# Patient Record
Sex: Female | Born: 1980
Health system: Southern US, Community
[De-identification: ages and names within clinical notes are randomized; demographics above are authoritative.]

## PROBLEM LIST (undated history)

## (undated) DIAGNOSIS — R7303 Prediabetes: Secondary | ICD-10-CM

## (undated) DIAGNOSIS — E538 Deficiency of other specified B group vitamins: Secondary | ICD-10-CM

## (undated) DIAGNOSIS — E782 Mixed hyperlipidemia: Secondary | ICD-10-CM

## (undated) DIAGNOSIS — D509 Iron deficiency anemia, unspecified: Secondary | ICD-10-CM

## (undated) DIAGNOSIS — E559 Vitamin D deficiency, unspecified: Secondary | ICD-10-CM

## (undated) DIAGNOSIS — E785 Hyperlipidemia, unspecified: Secondary | ICD-10-CM

## (undated) DIAGNOSIS — R0600 Dyspnea, unspecified: Secondary | ICD-10-CM

## (undated) DIAGNOSIS — N200 Calculus of kidney: Secondary | ICD-10-CM

## (undated) DIAGNOSIS — Z973 Presence of spectacles and contact lenses: Secondary | ICD-10-CM

## (undated) DIAGNOSIS — K760 Fatty (change of) liver, not elsewhere classified: Secondary | ICD-10-CM

## (undated) HISTORY — DX: Calculus of kidney: N20.0

## (undated) HISTORY — DX: Hyperlipidemia, unspecified: E78.5

---

## 2020-08-23 ENCOUNTER — Ambulatory Visit (INDEPENDENT_AMBULATORY_CARE_PROVIDER_SITE_OTHER): Payer: BC Managed Care – PPO | Admitting: Family Medicine

## 2020-08-23 ENCOUNTER — Encounter: Payer: Self-pay | Admitting: Family Medicine

## 2020-08-23 ENCOUNTER — Other Ambulatory Visit: Payer: Self-pay

## 2020-08-23 VITALS — BP 100/69 | HR 70 | Temp 98.2°F | Ht 63.5 in | Wt 206.6 lb

## 2020-08-23 DIAGNOSIS — E782 Mixed hyperlipidemia: Secondary | ICD-10-CM | POA: Diagnosis not present

## 2020-08-23 DIAGNOSIS — E559 Vitamin D deficiency, unspecified: Secondary | ICD-10-CM | POA: Diagnosis not present

## 2020-08-23 DIAGNOSIS — Z114 Encounter for screening for human immunodeficiency virus [HIV]: Secondary | ICD-10-CM

## 2020-08-23 DIAGNOSIS — Z1159 Encounter for screening for other viral diseases: Secondary | ICD-10-CM

## 2020-08-23 DIAGNOSIS — Z0000 Encounter for general adult medical examination without abnormal findings: Secondary | ICD-10-CM

## 2020-08-23 DIAGNOSIS — Z23 Encounter for immunization: Secondary | ICD-10-CM

## 2020-08-23 DIAGNOSIS — E538 Deficiency of other specified B group vitamins: Secondary | ICD-10-CM | POA: Insufficient documentation

## 2020-08-23 DIAGNOSIS — M5432 Sciatica, left side: Secondary | ICD-10-CM

## 2020-08-23 DIAGNOSIS — R7303 Prediabetes: Secondary | ICD-10-CM

## 2020-08-23 NOTE — Patient Instructions (Addendum)
Sciatica Rehab Ask your health care provider which exercises are safe for you. Do exercises exactly as told by your health care provider and adjust them as directed. It is normal to feel mild stretching, pulling, tightness, or discomfort as you do these exercises. Stop right away if you feel sudden pain or your pain gets worse. Do not begin these exercises until told by your health care provider. Stretching and range-of-motion exercises These exercises warm up your muscles and joints and improve the movement and flexibility of your hips and back. These exercises also help to relieve pain, numbness, and tingling. Sciatic nerve glide 1. Sit in a chair with your head facing down toward your chest. Place your hands behind your back. Let your shoulders slump forward. 2. Slowly straighten one of your legs while you tilt your head back as if you are looking toward the ceiling. Only straighten your leg as far as you can without making your symptoms worse. 3. Hold this position for __________ seconds. 4. Slowly return to the starting position. 5. Repeat with your other leg. Repeat __________ times. Complete this exercise __________ times a day. Knee to chest with hip adduction and internal rotation  1. Lie on your back on a firm surface with both legs straight. 2. Bend one of your knees and move it up toward your chest until you feel a gentle stretch in your lower back and buttock. Then, move your knee toward the shoulder that is on the opposite side from your leg. This is hip adduction and internal rotation. ? Hold your leg in this position by holding on to the front of your knee. 3. Hold this position for __________ seconds. 4. Slowly return to the starting position. 5. Repeat with your other leg. Repeat __________ times. Complete this exercise __________ times a day. Prone extension on elbows  1. Lie on your abdomen on a firm surface. A bed may be too soft for this exercise. 2. Prop yourself up on  your elbows. 3. Use your arms to help lift your chest up until you feel a gentle stretch in your abdomen and your lower back. ? This will place some of your body weight on your elbows. If this is uncomfortable, try stacking pillows under your chest. ? Your hips should stay down, against the surface that you are lying on. Keep your hip and back muscles relaxed. 4. Hold this position for __________ seconds. 5. Slowly relax your upper body and return to the starting position. Repeat __________ times. Complete this exercise __________ times a day. Strengthening exercises These exercises build strength and endurance in your back. Endurance is the ability to use your muscles for a long time, even after they get tired. Pelvic tilt This exercise strengthens the muscles that lie deep in the abdomen. 1. Lie on your back on a firm surface. Bend your knees and keep your feet flat on the floor. 2. Tense your abdominal muscles. Tip your pelvis up toward the ceiling and flatten your lower back into the floor. ? To help with this exercise, you may place a small towel under your lower back and try to push your back into the towel. 3. Hold this position for __________ seconds. 4. Let your muscles relax completely before you repeat this exercise. Repeat __________ times. Complete this exercise __________ times a day. Alternating arm and leg raises  1. Get on your hands and knees on a firm surface. If you are on a hard floor, you may want to use  padding, such as an exercise mat, to cushion your knees. 2. Line up your arms and legs. Your hands should be directly below your shoulders, and your knees should be directly below your hips. 3. Lift your left leg behind you. At the same time, raise your right arm and straighten it in front of you. ? Do not lift your leg higher than your hip. ? Do not lift your arm higher than your shoulder. ? Keep your abdominal and back muscles tight. ? Keep your hips facing the  ground. ? Do not arch your back. ? Keep your balance carefully, and do not hold your breath. 4. Hold this position for __________ seconds. 5. Slowly return to the starting position. 6. Repeat with your right leg and your left arm. Repeat __________ times. Complete this exercise __________ times a day. Posture and body mechanics Good posture and healthy body mechanics can help to relieve stress in your body's tissues and joints. Body mechanics refers to the movements and positions of your body while you do your daily activities. Posture is part of body mechanics. Good posture means:  Your spine is in its natural S-curve position (neutral).  Your shoulders are pulled back slightly.  Your head is not tipped forward. Follow these guidelines to improve your posture and body mechanics in your everyday activities. Standing   When standing, keep your spine neutral and your feet about hip width apart. Keep a slight bend in your knees. Your ears, shoulders, and hips should line up.  When you do a task in which you stand in one place for a long time, place one foot up on a stable object that is 2-4 inches (5-10 cm) high, such as a footstool. This helps keep your spine neutral. Sitting   When sitting, keep your spine neutral and keep your feet flat on the floor. Use a footrest, if necessary, and keep your thighs parallel to the floor. Avoid rounding your shoulders, and avoid tilting your head forward.  When working at a desk or a computer, keep your desk at a height where your hands are slightly lower than your elbows. Slide your chair under your desk so you are close enough to maintain good posture.  When working at a computer, place your monitor at a height where you are looking straight ahead and you do not have to tilt your head forward or downward to look at the screen. Resting  When lying down and resting, avoid positions that are most painful for you.  If you have pain with activities  such as sitting, bending, stooping, or squatting, lie in a position in which your body does not bend very much. For example, avoid curling up on your side with your arms and knees near your chest (fetal position).  If you have pain with activities such as standing for a long time or reaching with your arms, lie with your spine in a neutral position and bend your knees slightly. Try the following positions: ? Lying on your side with a pillow between your knees. ? Lying on your back with a pillow under your knees. Lifting   When lifting objects, keep your feet at least shoulder width apart and tighten your abdominal muscles.  Bend your knees and hips and keep your spine neutral. It is important to lift using the strength of your legs, not your back. Do not lock your knees straight out.  Always ask for help to lift heavy or awkward objects. This information is not  intended to replace advice given to you by your health care provider. Make sure you discuss any questions you have with your health care provider. Document Revised: 03/31/2019 Document Reviewed: 12/29/2018 Elsevier Patient Education  2020 [place] lots of labs Want you to come back for your pap smear Recommend flu shot in October Giving you tdap today.   So nice to meet you!  Dr. Rogers Blocker   Preventive Care 38-70 Years Old, Female Preventive care refers to visits with your health care provider and lifestyle choices that can promote health and wellness. This includes:  A yearly physical exam. This may also be called an annual well check.  Regular dental visits and eye exams.  Immunizations.  Screening for certain conditions.  Healthy lifestyle choices, such as eating a healthy diet, getting regular exercise, not using drugs or products that contain nicotine and tobacco, and limiting alcohol use. What can I expect for my preventive care visit? Physical exam Your health care provider will check your:  Height  and weight. This may be used to calculate body mass index (BMI), which tells if you are at a healthy weight.  Heart rate and blood pressure.  Skin for abnormal spots. Counseling Your health care provider may ask you questions about your:  Alcohol, tobacco, and drug use.  Emotional well-being.  Home and relationship well-being.  Sexual activity.  Eating habits.  Work and work Statistician.  Method of birth control.  Menstrual cycle.  Pregnancy history. What immunizations do I need?  Influenza (flu) vaccine  This is recommended every year. Tetanus, diphtheria, and pertussis (Tdap) vaccine  You may need a Td booster every 10 years. Varicella (chickenpox) vaccine  You may need this if you have not been vaccinated. Zoster (shingles) vaccine  You may need this after age 9. Measles, mumps, and rubella (MMR) vaccine  You may need at least one dose of MMR if you were born in 1957 or later. You may also need a second dose. Pneumococcal conjugate (PCV13) vaccine  You may need this if you have certain conditions and were not previously vaccinated. Pneumococcal polysaccharide (PPSV23) vaccine  You may need one or two doses if you smoke cigarettes or if you have certain conditions. Meningococcal conjugate (MenACWY) vaccine  You may need this if you have certain conditions. Hepatitis A vaccine  You may need this if you have certain conditions or if you travel or work in places where you may be exposed to hepatitis A. Hepatitis B vaccine  You may need this if you have certain conditions or if you travel or work in places where you may be exposed to hepatitis B. Haemophilus influenzae type b (Hib) vaccine  You may need this if you have certain conditions. Human papillomavirus (HPV) vaccine  If recommended by your health care provider, you may need three doses over 6 months. You may receive vaccines as individual doses or as more than one vaccine together in one shot  (combination vaccines). Talk with your health care provider about the risks and benefits of combination vaccines. What tests do I need? Blood tests  Lipid and cholesterol levels. These may be checked every 5 years, or more frequently if you are over 7 years old.  Hepatitis C test.  Hepatitis B test. Screening  Lung cancer screening. You may have this screening every year starting at age 30 if you have a 30-pack-year history of smoking and currently smoke or have quit within the past 15 years.  Colorectal  cancer screening. All adults should have this screening starting at age 48 and continuing until age 36. Your health care provider may recommend screening at age 80 if you are at increased risk. You will have tests every 1-10 years, depending on your results and the type of screening test.  Diabetes screening. This is done by checking your blood sugar (glucose) after you have not eaten for a while (fasting). You may have this done every 1-3 years.  Mammogram. This may be done every 1-2 years. Talk with your health care provider about when you should start having regular mammograms. This may depend on whether you have a family history of breast cancer.  BRCA-related cancer screening. This may be done if you have a family history of breast, ovarian, tubal, or peritoneal cancers.  Pelvic exam and Pap test. This may be done every 3 years starting at age 63. Starting at age 52, this may be done every 5 years if you have a Pap test in combination with an HPV test. Other tests  Sexually transmitted disease (STD) testing.  Bone density scan. This is done to screen for osteoporosis. You may have this scan if you are at high risk for osteoporosis. Follow these instructions at home: Eating and drinking  Eat a diet that includes fresh fruits and vegetables, whole grains, lean protein, and low-fat dairy.  Take vitamin and mineral supplements as recommended by your health care provider.  Do not  drink alcohol if: ? Your health care provider tells you not to drink. ? You are pregnant, may be pregnant, or are planning to become pregnant.  If you drink alcohol: ? Limit how much you have to 0-1 drink a day. ? Be aware of how much alcohol is in your drink. In the [place] one drink equals one 12 oz bottle of beer (355 mL), one 5 oz glass of wine (148 mL), or one 1 oz glass of hard liquor (44 mL). Lifestyle  Take daily care of your teeth and gums.  Stay active. Exercise for at least 30 minutes on 5 or more days each week.  Do not use any products that contain nicotine or tobacco, such as cigarettes, e-cigarettes, and chewing tobacco. If you need help quitting, ask your health care provider.  If you are sexually active, practice safe sex. Use a condom or other form of birth control (contraception) in order to prevent pregnancy and STIs (sexually transmitted infections).  If told by your health care provider, take low-dose aspirin daily starting at age 104. What's next?  Visit your health care provider once a year for a well check visit.  Ask your health care provider how often you should have your eyes and teeth checked.  Stay up to date on all vaccines. This information is not intended to replace advice given to you by your health care provider. Make sure you discuss any questions you have with your health care provider. Document Revised: 08/18/2018 Document Reviewed: 08/18/2018 Elsevier Patient Education  2020 [employer].

## 2020-08-23 NOTE — Progress Notes (Signed)
Patient: Breanna Byrd MRN: [id number] DOB: 1981-11-30 PCP: Orma Flaming, MD     Subjective:  Chief Complaint  Patient presents with  . Annual Exam  . b12 deficiency  . vitamin D deficiency  . Hyperlipidemia  . screening breast cancer  . Prediabetes  . Back Pain    HPI: The patient is a 39 y.o. female who presents today for annual exam. She denies any changes to past medical history. There have been no recent hospitalizations. They are following a well balanced diet and exercise plan.She participates in weight training 2-4 times weekly. Weight has been decreasing steadily. She complains of back pain x 15 days. Also brought all of her records from [place].   She has a weight loss trainer as well.   No family history of colon or breast cancer.   She has history of vitamin D deficiency and b12 deficiency  Hyperlipidemia Was on crestor equivelant in the past in [place] and cholesterol was to goal (brought her cholesterol levels from march in [place]). She has been off medication since that time and would like to recheck.   Left sided back pain Has been going on x 15 days starts in her lower left back and travels down her buttocks, back of her leg. Worse with going up hills. Has not taken any medication and states it is slightly better. Has never happened to her before. She no longer has the pain.   Immunization History  Administered Date(s) Administered  . Tdap 08/23/2020    Colonoscopy: routine screening  Mammogram: last one in 2019 in [place]. Due for this  Pap smear: due for this.  Tdap: will give today    Review of Systems  Constitutional: Negative for chills, fatigue and fever.  HENT: Negative for dental problem, ear pain, hearing loss and trouble swallowing.   Eyes: Negative for visual disturbance.  Respiratory: Negative for cough, chest tightness and shortness of breath.   Cardiovascular: Negative for chest pain, palpitations and leg swelling.  Gastrointestinal:  Negative for blood in stool, diarrhea, nausea and vomiting.  Endocrine: Negative for cold intolerance, polydipsia, polyphagia and polyuria.  Genitourinary: Negative for dysuria, frequency, hematuria and urgency.  Musculoskeletal: Negative for arthralgias.  Skin: Negative for rash.  Neurological: Negative for dizziness and headaches.  Psychiatric/Behavioral: Negative for dysphoric mood and sleep disturbance. The patient is not nervous/anxious.     Allergies Patient has No Known Allergies.  Past Medical History Patient  has no past medical history on file.  Surgical History Patient  has no past surgical history on file.  Family History Pateint's family history is not on file.  Social History Patient  reports that she has never smoked. She has never used smokeless tobacco. She reports previous alcohol use. She reports that she does not use drugs.    Objective: Vitals:   08/23/20 0844  BP: 100/69  Pulse: 70  Temp: 98.2 F (36.8 C)  TempSrc: Temporal  SpO2: 100%  Weight: 206 lb 9.6 oz (93.7 kg)  Height: 5' 3.5" (1.613 m)    Body mass index is 36.02 kg/m.  Physical Exam Vitals reviewed.  Constitutional:      Appearance: Normal appearance. She is well-developed. She is obese.  HENT:     Head: Normocephalic and atraumatic.     Right Ear: Tympanic membrane, ear canal and external ear normal.     Left Ear: Tympanic membrane, ear canal and external ear normal.     Nose: Nose normal.     Mouth/Throat:  Mouth: Mucous membranes are moist.  Eyes:     Extraocular Movements: Extraocular movements intact.     Conjunctiva/sclera: Conjunctivae normal.     Pupils: Pupils are equal, round, and reactive to light.  Neck:     Thyroid: No thyromegaly.     Vascular: No carotid bruit.  Cardiovascular:     Rate and Rhythm: Normal rate and regular rhythm.     Heart sounds: Normal heart sounds. No murmur heard.   Pulmonary:     Effort: Pulmonary effort is normal.     Breath  sounds: Normal breath sounds.  Abdominal:     General: Bowel sounds are normal. There is no distension.     Palpations: Abdomen is soft.     Tenderness: There is no abdominal tenderness.  Musculoskeletal:        General: No tenderness.     Cervical back: Normal range of motion and neck supple.     Comments: No back pain today   Lymphadenopathy:     Cervical: No cervical adenopathy.  Skin:    General: Skin is warm and dry.     Capillary Refill: Capillary refill takes less than 2 seconds.     Findings: No rash.  Neurological:     General: No focal deficit present.     Mental Status: She is alert and oriented to person, place, and time.     Cranial Nerves: No cranial nerve deficit.     Motor: No weakness.     Coordination: Coordination normal.     Gait: Gait normal.     Deep Tendon Reflexes: Reflexes normal.  Psychiatric:        Mood and Affect: Mood normal.        Behavior: Behavior normal.          Office Visit from 08/23/2020 in [place]  PHQ-2 Total Score 0      Assessment/plan: 1. Annual physical exam Routine labs will be done today. She had her records from [place] and I reviewed these as well. HM addressed today. Will need to come back for pap smear. She is working with weight loss trainer and encouraged her to continue this. F/u in 3 months for pap smear.  Patient counseling [x]    Nutrition: Stressed importance of moderation in sodium/caffeine intake, saturated fat and cholesterol, caloric balance, sufficient intake of fresh fruits, vegetables, fiber, calcium, iron, and 1 mg of folate supplement per day (for females capable of pregnancy).  [x]    Stressed the importance of regular exercise.   []    Substance Abuse: Discussed cessation/primary prevention of tobacco, alcohol, or other drug use; driving or other dangerous activities under the influence; availability of treatment for abuse.   [x]    Injury prevention: Discussed safety belts, safety  helmets, smoke detector, smoking near bedding or upholstery.   [x]    Sexuality: Discussed sexually transmitted diseases, partner selection, use of condoms, avoidance of unintended pregnancy  and contraceptive alternatives.  [x]    Dental health: Discussed importance of regular tooth brushing, flossing, and dental visits.  [x]    Health maintenance and immunizations reviewed. Please refer to Health maintenance section.    - Comprehensive metabolic panel; Future - CBC with Differential/Platelet; Future - TSH; Future - Urinalysis, Routine w reflex microscopic; Future - Urinalysis, Routine w reflex microscopic - TSH - CBC with Differential/Platelet - Comprehensive metabolic panel  2. Hyperlipidemia, mixed Labs to goal from [place]; however, she was on crestor equivalent and this was stopped for trial off  medication. Will see how she is doing off medication.  - Lipid panel; Future - Lipid panel  3. B12 deficiency  - Vitamin B12; Future - Vitamin B12  4. Encounter for screening for HIV  - HIV Antibody (routine testing w rflx); Future - HIV Antibody (routine testing w rflx)  5. Encounter for hepatitis C screening test for low risk patient  - Hepatitis C antibody; Future - Hepatitis C antibody  6. Vitamin D deficiency  - VITAMIN D 25 Hydroxy (Vit-D Deficiency, Fractures); Future - VITAMIN D 25 Hydroxy (Vit-D Deficiency, Fractures)  7. Need for Tdap vaccination   8. Prediabetes a1c of 5.8 from records in [place]. Had labs done in March of this year.  - Hemoglobin A1c; Future - Hemoglobin A1c  9. Sciatica of left side No pain right now/normal exam. Handout of exercises given. If pain returns despite exercises would recommend xray and PT referral. They will let me know.     This visit occurred during the SARS-CoV-2 public health emergency.  Safety protocols were in place, including screening questions prior to the visit, additional usage of staff PPE, and extensive cleaning of  exam room while observing appropriate contact time as indicated for disinfecting solutions.     Return in about 3 months (around 11/22/2020) for pap smear! Orma Flaming, MD [facility]  08/23/2020

## 2020-08-27 LAB — COMPREHENSIVE METABOLIC PANEL
AG Ratio: 1.5 (calc) (ref 1.0–2.5)
ALT: 14 U/L (ref 6–29)
AST: 13 U/L (ref 10–30)
Albumin: 4.3 g/dL (ref 3.6–5.1)
Alkaline phosphatase (APISO): 76 U/L (ref 31–125)
BUN: 10 mg/dL (ref 7–25)
CO2: 26 mmol/L (ref 20–32)
Calcium: 9.6 mg/dL (ref 8.6–10.2)
Chloride: 103 mmol/L (ref 98–110)
Creat: 0.57 mg/dL (ref 0.50–1.10)
Globulin: 2.9 g/dL (calc) (ref 1.9–3.7)
Glucose, Bld: 89 mg/dL (ref 65–99)
Potassium: 4.6 mmol/L (ref 3.5–5.3)
Sodium: 139 mmol/L (ref 135–146)
Total Bilirubin: 0.4 mg/dL (ref 0.2–1.2)
Total Protein: 7.2 g/dL (ref 6.1–8.1)

## 2020-08-27 LAB — URINALYSIS, ROUTINE W REFLEX MICROSCOPIC
Bilirubin Urine: NEGATIVE
Glucose, UA: NEGATIVE
Hgb urine dipstick: NEGATIVE
Ketones, ur: NEGATIVE
Leukocytes,Ua: NEGATIVE
Nitrite: NEGATIVE
Protein, ur: NEGATIVE
Specific Gravity, Urine: 1.011 (ref 1.001–1.03)
pH: 6.5 (ref 5.0–8.0)

## 2020-08-27 LAB — CBC WITH DIFFERENTIAL/PLATELET
Absolute Monocytes: 480 cells/uL (ref 200–950)
Basophils Absolute: 30 cells/uL (ref 0–200)
Basophils Relative: 0.5 %
Eosinophils Absolute: 120 cells/uL (ref 15–500)
Eosinophils Relative: 2 %
HCT: 39.4 % (ref 35.0–45.0)
Hemoglobin: 12.4 g/dL (ref 11.7–15.5)
Lymphs Abs: 1794 cells/uL (ref 850–3900)
MCH: 25.5 pg — ABNORMAL LOW (ref 27.0–33.0)
MCHC: 31.5 g/dL — ABNORMAL LOW (ref 32.0–36.0)
MCV: 80.9 fL (ref 80.0–100.0)
MPV: 9 fL (ref 7.5–12.5)
Monocytes Relative: 8 %
Neutro Abs: 3576 cells/uL (ref 1500–7800)
Neutrophils Relative %: 59.6 %
Platelets: 381 10*3/uL (ref 140–400)
RBC: 4.87 10*6/uL (ref 3.80–5.10)
RDW: 14.6 % (ref 11.0–15.0)
Total Lymphocyte: 29.9 %
WBC: 6 10*3/uL (ref 3.8–10.8)

## 2020-08-27 LAB — VITAMIN D 25 HYDROXY (VIT D DEFICIENCY, FRACTURES): Vit D, 25-Hydroxy: 20 ng/mL — ABNORMAL LOW (ref 30–100)

## 2020-08-27 LAB — HEMOGLOBIN A1C
Hgb A1c MFr Bld: 5.6 % of total Hgb (ref <5.7)
Mean Plasma Glucose: 114 (calc)
eAG (mmol/L): 6.3 (calc)

## 2020-08-27 LAB — LIPID PANEL
Cholesterol: 249 mg/dL — ABNORMAL HIGH (ref <200)
HDL: 54 mg/dL (ref >=50)
LDL Cholesterol (Calc): 166 mg/dL (calc) — ABNORMAL HIGH
Non-HDL Cholesterol (Calc): 195 mg/dL (calc) — ABNORMAL HIGH (ref <130)
Total CHOL/HDL Ratio: 4.6 (calc) (ref <5.0)
Triglycerides: 146 mg/dL (ref <150)

## 2020-08-27 LAB — HEPATITIS C ANTIBODY
Hepatitis C Ab: NONREACTIVE
SIGNAL TO CUT-OFF: 0.01 (ref <1.00)

## 2020-08-27 LAB — VITAMIN B12: Vitamin B-12: 332 pg/mL (ref 200–1100)

## 2020-08-27 LAB — HIV ANTIBODY (ROUTINE TESTING W REFLEX): HIV 1&2 Ab, 4th Generation: NONREACTIVE

## 2020-08-27 LAB — TSH: TSH: 2.18 mIU/L

## 2020-08-28 ENCOUNTER — Other Ambulatory Visit: Payer: Self-pay | Admitting: Family Medicine

## 2020-08-28 DIAGNOSIS — E782 Mixed hyperlipidemia: Secondary | ICD-10-CM

## 2020-08-28 MED ORDER — VITAMIN D (ERGOCALCIFEROL) 1.25 MG (50000 UNIT) PO CAPS: ORAL_CAPSULE | ORAL | 0 refills | Status: DC

## 2020-09-28 ENCOUNTER — Other Ambulatory Visit: Payer: Self-pay

## 2020-09-28 ENCOUNTER — Encounter (HOSPITAL_COMMUNITY): Payer: Self-pay

## 2020-09-28 ENCOUNTER — Emergency Department (HOSPITAL_COMMUNITY)
Admission: EM | Admit: 2020-09-28 | Discharge: 2020-09-28 | Disposition: A | Discharge status: Home / Self Care | Payer: BC Managed Care – PPO | Attending: Emergency Medicine | Admitting: Emergency Medicine

## 2020-09-28 ENCOUNTER — Encounter: Payer: Self-pay | Admitting: Family Medicine

## 2020-09-28 DIAGNOSIS — Z5321 Procedure and treatment not carried out due to patient leaving prior to being seen by health care provider: Secondary | ICD-10-CM | POA: Diagnosis not present

## 2020-09-28 DIAGNOSIS — R1010 Upper abdominal pain, unspecified: Secondary | ICD-10-CM | POA: Diagnosis not present

## 2020-09-28 DIAGNOSIS — R197 Diarrhea, unspecified: Secondary | ICD-10-CM | POA: Insufficient documentation

## 2020-09-28 LAB — URINALYSIS, ROUTINE W REFLEX MICROSCOPIC
Bilirubin Urine: NEGATIVE
Glucose, UA: NEGATIVE mg/dL
Hgb urine dipstick: NEGATIVE
Ketones, ur: NEGATIVE mg/dL
Nitrite: NEGATIVE
Protein, ur: NEGATIVE mg/dL
Specific Gravity, Urine: 1.009 (ref 1.005–1.030)
pH: 6 (ref 5.0–8.0)

## 2020-09-28 LAB — CBC
HCT: 39.4 % (ref 36.0–46.0)
Hemoglobin: 11.8 g/dL — ABNORMAL LOW (ref 12.0–15.0)
MCH: 24.5 pg — ABNORMAL LOW (ref 26.0–34.0)
MCHC: 29.9 g/dL — ABNORMAL LOW (ref 30.0–36.0)
MCV: 81.7 fL (ref 80.0–100.0)
Platelets: 322 10*3/uL (ref 150–400)
RBC: 4.82 MIL/uL (ref 3.87–5.11)
RDW: 15.8 % — ABNORMAL HIGH (ref 11.5–15.5)
WBC: 8.6 10*3/uL (ref 4.0–10.5)
nRBC: 0 % (ref 0.0–0.2)

## 2020-09-28 LAB — COMPREHENSIVE METABOLIC PANEL
ALT: 35 U/L (ref 0–44)
AST: 59 U/L — ABNORMAL HIGH (ref 15–41)
Albumin: 3.9 g/dL (ref 3.5–5.0)
Alkaline Phosphatase: 66 U/L (ref 38–126)
Anion gap: 11 (ref 5–15)
BUN: 9 mg/dL (ref 6–20)
CO2: 23 mmol/L (ref 22–32)
Calcium: 9.6 mg/dL (ref 8.9–10.3)
Chloride: 107 mmol/L (ref 98–111)
Creatinine, Ser: 0.68 mg/dL (ref 0.44–1.00)
GFR, Estimated: >60 mL/min (ref >60)
Glucose, Bld: 129 mg/dL — ABNORMAL HIGH (ref 70–99)
Potassium: 3.5 mmol/L (ref 3.5–5.1)
Sodium: 141 mmol/L (ref 135–145)
Total Bilirubin: 0.7 mg/dL (ref 0.3–1.2)
Total Protein: 6.7 g/dL (ref 6.5–8.1)

## 2020-09-28 LAB — LIPASE, BLOOD: Lipase: 34 U/L (ref 11–51)

## 2020-09-28 LAB — I-STAT BETA HCG BLOOD, ED (MC, WL, AP ONLY): I-stat hCG, quantitative: <5 m[IU]/mL (ref <5)

## 2020-09-28 MED ORDER — ACETAMINOPHEN 325 MG PO TABS
650.0000 mg | ORAL_TABLET | Freq: Once | ORAL | Status: AC
  Administered 2020-09-28: 650 mg via ORAL
  Filled 2020-09-28: qty 2

## 2020-09-28 NOTE — ED Notes (Signed)
Pt states that she is leaving due to wait times  

## 2020-09-28 NOTE — ED Notes (Signed)
Requesting pain meds

## 2020-09-28 NOTE — ED Triage Notes (Signed)
Pt states that at 9pm she began to have upper abd pain that radiates to her back, denies nausea, some diarrhea.

## 2020-09-30 ENCOUNTER — Encounter: Payer: Self-pay | Admitting: Family Medicine

## 2020-09-30 ENCOUNTER — Other Ambulatory Visit: Payer: Self-pay

## 2020-09-30 ENCOUNTER — Ambulatory Visit (INDEPENDENT_AMBULATORY_CARE_PROVIDER_SITE_OTHER): Payer: BC Managed Care – PPO | Admitting: Family Medicine

## 2020-09-30 VITALS — BP 100/60 | HR 67 | Temp 97.9°F | Ht 63.0 in | Wt 203.8 lb

## 2020-09-30 DIAGNOSIS — R7401 Elevation of levels of liver transaminase levels: Secondary | ICD-10-CM

## 2020-09-30 DIAGNOSIS — R1032 Left lower quadrant pain: Secondary | ICD-10-CM | POA: Diagnosis not present

## 2020-09-30 DIAGNOSIS — R1013 Epigastric pain: Secondary | ICD-10-CM

## 2020-09-30 LAB — COMPLETE METABOLIC PANEL WITH GFR
AG Ratio: 1.6 (calc) (ref 1.0–2.5)
ALT: 134 U/L — ABNORMAL HIGH (ref 6–29)
AST: 43 U/L — ABNORMAL HIGH (ref 10–30)
Albumin: 4.3 g/dL (ref 3.6–5.1)
Alkaline phosphatase (APISO): 93 U/L (ref 31–125)
BUN: 9 mg/dL (ref 7–25)
CO2: 26 mmol/L (ref 20–32)
Calcium: 9.8 mg/dL (ref 8.6–10.2)
Chloride: 104 mmol/L (ref 98–110)
Creat: 0.69 mg/dL (ref 0.50–1.10)
GFR, Est African American: 126 mL/min/{1.73_m2} (ref >=60)
GFR, Est Non African American: 109 mL/min/{1.73_m2} (ref >=60)
Globulin: 2.7 g/dL (calc) (ref 1.9–3.7)
Glucose, Bld: 91 mg/dL (ref 65–99)
Potassium: 4.2 mmol/L (ref 3.5–5.3)
Sodium: 138 mmol/L (ref 135–146)
Total Bilirubin: 0.6 mg/dL (ref 0.2–1.2)
Total Protein: 7 g/dL (ref 6.1–8.1)

## 2020-09-30 NOTE — Progress Notes (Signed)
Patient: Breanna Byrd MRN: [id number] DOB: 01-21-1981 PCP: Orma Flaming, MD     Subjective:  Chief Complaint  Patient presents with   Follow-up    ED visit   Abdominal Pain    HPI: The patient is a 39 y.o. female who presents today for ED visit. Pt was seen for severe abd pain. She says that she feels a lot better. Went to ER on 09/28/2020. Left due to wait times. No imaging done. CBC, CmP, urine hcg and UA performed.   She started to get pain on 10/9 late at night and it continuely to get more severe. Pain started in her mid abdomen and radiated up her chest. She also had some back pain. She states when the pain radiated up her chest she had a hard time breathing. She has no burning. She did have nausea and 2 episodes of diarrhea. She states pain was more severe than labor pain and rated as a 10/10. She had pain for about 2 hours. She took 2 ranitidine at home with no relief. The pain increased with this and after she went to ER she requested a pain reliever and was given 650mg  of tylenol and pain went away. She has no pain at this time.    She denies any blood in her stool. She still has her gallbladder and appendix. She never had a fever. She never vomited, but just nausea. She denies any constipation. She had some fried type of food with dinner, tofu, rice. She has not had any pain with food since this initial time.   She has no symptoms now.   Labs reviewed at ER.  No wBC Urine pregnancy negative UA unimpressive  Ast: mild elevated to 59  Review of Systems  Constitutional: Negative for chills, fatigue and fever.  HENT: Negative for dental problem, ear pain, hearing loss and trouble swallowing.   Eyes: Negative for visual disturbance.  Respiratory: Negative for cough, chest tightness and shortness of breath.   Cardiovascular: Negative for chest pain, palpitations and leg swelling.  Gastrointestinal: Negative for abdominal pain, blood in stool, diarrhea and nausea.   Endocrine: Negative for cold intolerance, polydipsia, polyphagia and polyuria.  Genitourinary: Negative for dysuria, hematuria and pelvic pain.  Musculoskeletal: Negative for arthralgias and back pain.  Skin: Negative for rash.  Neurological: Positive for headaches. Negative for dizziness.  Psychiatric/Behavioral: Negative for dysphoric mood and sleep disturbance. The patient is not nervous/anxious.     Allergies Patient has No Known Allergies.  Past Medical History Patient  has no past medical history on file.  Surgical History Patient  has no past surgical history on file.  Family History Pateint's family history is not on file.  Social History Patient  reports that she has never smoked. She has never used smokeless tobacco. She reports previous alcohol use. She reports that she does not use drugs.    Objective: Vitals:   09/30/20 1100  BP: 100/60  Pulse: 67  Temp: 97.9 F (36.6 C)  TempSrc: Temporal  SpO2: 98%  Weight: 203 lb 12.8 oz (92.4 kg)  Height: 5\' 3"  (1.6 m)    Body mass index is 36.1 kg/m.  Physical Exam Vitals reviewed.  Constitutional:      Appearance: She is well-developed. She is obese.  HENT:     Head: Normocephalic and atraumatic.  Cardiovascular:     Rate and Rhythm: Normal rate and regular rhythm.     Heart sounds: Normal heart sounds.  Pulmonary:  Effort: Pulmonary effort is normal.  Abdominal:     General: Bowel sounds are normal.     Palpations: Abdomen is soft.     Tenderness: There is abdominal tenderness in the left lower quadrant. There is no guarding or rebound. Negative signs include Murphy's sign, Rovsing's sign, McBurney's sign, psoas sign and obturator sign.  Neurological:     General: No focal deficit present.     Mental Status: She is alert and oriented to person, place, and time.  Psychiatric:        Mood and Affect: Mood normal.        Behavior: Behavior normal.        Assessment/plan: 1. Epigastric  pain Resolved. Discussed gallstones on differential with clinical complaints, but she has had no pain now and negative exam. Will repeat liver enzyme and if elevated we will get ultrasound. She is to keep food journal and see if symptoms return with food.   2. LLQ pain Minimal pain on exam. Unimpressive. Diverticulitis part of differential, but no white count/fever/diarrhea/blood in stool and symptoms have resolved. ? Gas. Discussed if happens again we can order CT and they will need to let me know immediately. Precautions given. Exam negative for appendicitis as well.   3. Elevated AST (SGOT)  - COMPLETE METABOLIC PANEL WITH GFR; Future - COMPLETE METABOLIC PANEL WITH GFR   This visit occurred during the SARS-CoV-2 public health emergency.  Safety protocols were in place, including screening questions prior to the visit, additional usage of staff PPE, and extensive cleaning of exam room while observing appropriate contact time as indicated for disinfecting solutions.     Return if symptoms worsen or fail to improve.   Orma Flaming, MD [place]   09/30/2020

## 2020-09-30 NOTE — Patient Instructions (Signed)
-  labs overall reassuring from ER. Checking liver enzyme again today.  If you have pain again, I need you to call me ASAP so we can get imaging, but nothing really warrants this right now.   Things to watch for: pain after eating or with food.  Fever Diarrhea Worsening pain and fever: ER

## 2020-10-01 ENCOUNTER — Other Ambulatory Visit: Payer: Self-pay | Admitting: Family Medicine

## 2020-10-01 ENCOUNTER — Telehealth: Payer: Self-pay

## 2020-10-01 DIAGNOSIS — R1013 Epigastric pain: Secondary | ICD-10-CM

## 2020-10-01 DIAGNOSIS — R748 Abnormal levels of other serum enzymes: Secondary | ICD-10-CM

## 2020-10-01 NOTE — Telephone Encounter (Signed)
Ordered a stat ultasound to rule out gallstone. Mychart message sent.  Orma Flaming, MD [place]

## 2020-10-01 NOTE — Telephone Encounter (Signed)
Patient's husband is calling in concerned about his wife's test results, asked for a returned phone call once Dr.Wolfe has reviewed them.

## 2020-10-01 NOTE — Progress Notes (Signed)
Pt scheduled for [place] on 10/02/2020 at [facility]

## 2020-10-01 NOTE — Telephone Encounter (Signed)
Spoke with the pt to make her aware. Pt voiced understanding.

## 2020-10-01 NOTE — Progress Notes (Signed)
Hey blair,  Stat abdominal ultrasound. Please get this done today or tomorrow if possible. Thanks so much,  Dr. Rogers Blocker

## 2020-10-02 ENCOUNTER — Other Ambulatory Visit: Payer: Self-pay | Admitting: Family Medicine

## 2020-10-02 ENCOUNTER — Ambulatory Visit (HOSPITAL_COMMUNITY)
Admission: RE | Admit: 2020-10-02 | Discharge: 2020-10-02 | Disposition: A | Discharge status: Home / Self Care | Payer: BC Managed Care – PPO | Source: Ambulatory Visit | Attending: Family Medicine | Admitting: Family Medicine

## 2020-10-02 ENCOUNTER — Other Ambulatory Visit: Payer: Self-pay

## 2020-10-02 DIAGNOSIS — R748 Abnormal levels of other serum enzymes: Secondary | ICD-10-CM

## 2020-10-02 DIAGNOSIS — N200 Calculus of kidney: Secondary | ICD-10-CM | POA: Diagnosis not present

## 2020-10-02 DIAGNOSIS — R1013 Epigastric pain: Secondary | ICD-10-CM | POA: Insufficient documentation

## 2020-10-03 ENCOUNTER — Other Ambulatory Visit: Payer: Self-pay | Admitting: Family Medicine

## 2020-10-03 DIAGNOSIS — R748 Abnormal levels of other serum enzymes: Secondary | ICD-10-CM

## 2020-10-09 ENCOUNTER — Ambulatory Visit: Payer: BC Managed Care – PPO | Admitting: Family Medicine

## 2020-10-10 ENCOUNTER — Other Ambulatory Visit: Payer: BC Managed Care – PPO

## 2020-10-10 ENCOUNTER — Other Ambulatory Visit: Payer: Self-pay

## 2020-10-10 DIAGNOSIS — R748 Abnormal levels of other serum enzymes: Secondary | ICD-10-CM

## 2020-10-10 NOTE — Addendum Note (Signed)
Addended by: Liliane Channel on: 10/10/2020 10:35 AM   Modules accepted: Orders

## 2020-10-14 LAB — HEPATIC FUNCTION PANEL
AG Ratio: 1.7 (calc) (ref 1.0–2.5)
ALT: 16 U/L (ref 6–29)
AST: 13 U/L (ref 10–30)
Albumin: 4.4 g/dL (ref 3.6–5.1)
Alkaline phosphatase (APISO): 74 U/L (ref 31–125)
Bilirubin, Direct: 0.1 mg/dL (ref 0.0–0.2)
Globulin: 2.6 g/dL (calc) (ref 1.9–3.7)
Indirect Bilirubin: 0.4 mg/dL (calc) (ref 0.2–1.2)
Total Bilirubin: 0.5 mg/dL (ref 0.2–1.2)
Total Protein: 7 g/dL (ref 6.1–8.1)

## 2020-10-14 LAB — HEPATITIS PANEL, ACUTE
Hep A IgM: NONREACTIVE
Hep B C IgM: NONREACTIVE
Hepatitis B Surface Ag: NONREACTIVE
Hepatitis C Ab: NONREACTIVE
SIGNAL TO CUT-OFF: 0.01 (ref <1.00)

## 2020-10-22 ENCOUNTER — Ambulatory Visit (INDEPENDENT_AMBULATORY_CARE_PROVIDER_SITE_OTHER): Payer: BC Managed Care – PPO

## 2020-10-22 ENCOUNTER — Other Ambulatory Visit: Payer: Self-pay

## 2020-10-22 DIAGNOSIS — Z23 Encounter for immunization: Secondary | ICD-10-CM | POA: Diagnosis not present

## 2020-11-01 ENCOUNTER — Ambulatory Visit: Payer: Self-pay | Admitting: Family Medicine

## 2020-11-20 ENCOUNTER — Encounter: Payer: Self-pay | Admitting: Family Medicine

## 2020-11-22 ENCOUNTER — Ambulatory Visit (INDEPENDENT_AMBULATORY_CARE_PROVIDER_SITE_OTHER): Payer: BC Managed Care – PPO | Admitting: Family Medicine

## 2020-11-22 ENCOUNTER — Other Ambulatory Visit: Payer: Self-pay

## 2020-11-22 ENCOUNTER — Encounter: Payer: Self-pay | Admitting: Family Medicine

## 2020-11-22 ENCOUNTER — Other Ambulatory Visit (HOSPITAL_COMMUNITY)
Admission: RE | Admit: 2020-11-22 | Discharge: 2020-11-22 | Disposition: A | Discharge status: Home / Self Care | Payer: BC Managed Care – PPO | Source: Ambulatory Visit | Attending: Family Medicine | Admitting: Family Medicine

## 2020-11-22 VITALS — BP 109/65 | HR 70 | Temp 98.6°F | Ht 63.0 in | Wt 210.2 lb

## 2020-11-22 DIAGNOSIS — Z01419 Encounter for gynecological examination (general) (routine) without abnormal findings: Secondary | ICD-10-CM

## 2020-11-22 DIAGNOSIS — M5431 Sciatica, right side: Secondary | ICD-10-CM | POA: Diagnosis not present

## 2020-11-22 DIAGNOSIS — M5432 Sciatica, left side: Secondary | ICD-10-CM

## 2020-11-22 MED ORDER — GABAPENTIN 300 MG PO CAPS: 300.0000 mg | ORAL_CAPSULE | Freq: Three times a day (TID) | ORAL | 0 refills | Status: DC | PRN

## 2020-11-22 NOTE — Progress Notes (Signed)
SUBJECTIVE:  39 y.o. female for annual routine Pap and checkup.  Menarch started at age 39 years. Periods have always been normal. Cycle is every 25 days and period lasts for 4 days. No hx of abnormal pap smears. No hx of STDs. G2p2 SVD and were normal/healthy. Forceps were used in both. She breast fed both babies. She has no breast complaints. No vaginal complaints and no pain with sex. No breast cancer or colon cancer. She is due for her mmg this year. She is on day 20 of her cycle. No birth control.   She has pain under her left buttock and radiates down the back of her leg and is tingling in nature. Rated as a 10/10 and can last a short time up to 2 hours. Pain started about 3 months ago. She also will get on the right side as well. She notices after sitting for a long time or laying on her side for a long time. No leg weakness. She took a tylenol yesterday and this helped.   Current Outpatient Medications  Medication Sig Dispense Refill  . Vitamin D, Ergocalciferol, (DRISDOL) 1.25 MG (50000 UNIT) CAPS capsule One capsule by mouth once a week for 12 weeks. Then take 2000IU/day 12 capsule 0   No current facility-administered medications for this visit.   Allergies: Patient has no known allergies.  No LMP recorded.  ROS:  Feeling well. No dyspnea or chest pain on exertion.  No abdominal pain, change in bowel habits, black or bloody stools.  No urinary tract symptoms. GYN ROS: normal menses, no abnormal bleeding, pelvic pain or discharge, no breast pain or new or enlarging lumps on self exam. +bilateral tingling down back of legs.   OBJECTIVE:  The patient appears well, alert, oriented x 3, in no distress. Temp 98.6 F (37 C) (Temporal)   Ht 5\' 3"  (1.6 m)   Wt 210 lb 3.2 oz (95.3 kg)   BMI 37.24 kg/m  ENT normal.  Neck supple. No adenopathy or thyromegaly. PERLA. Lungs are clear, good air entry, no wheezes, rhonchi or rales. S1 and S2 normal, no murmurs, regular rate and rhythm. Abdomen  soft without tenderness, guarding, mass or organomegaly. Extremities show no edema, normal peripheral pulses. Neurological is normal, no focal findings.  BREAST EXAM: breasts appear normal, no suspicious masses, no skin or nipple changes or axillary nodes  PELVIC EXAM: normal external genitalia, vulva, vagina, cervix, uterus and adnexa. cervix is very friable around external os.   ASSESSMENT:  well woman with pap smear Bilateral sciatica   PLAN:  Mammogram information given.  pap smear Bilateral sciatica:  referral to PT, discussed ice/heat. Exercises given. Prn ibuprofen and gabapentin.  return annually or prn  This visit occurred during the SARS-CoV-2 public health emergency.  Safety protocols were in place, including screening questions prior to the visit, additional usage of staff PPE, and extensive cleaning of exam room while observing appropriate contact time as indicated for disinfecting solutions.   Orma Flaming, MD [place]

## 2020-11-22 NOTE — Patient Instructions (Addendum)
-sending to PT for this -can take ibuprofen as needed when this flairs -also sending in a nerve medication called gabapentin to help with nerve pain. Can take this as needed up to three times a day.  -stretching is key!   Sciatica  Sciatica is pain, weakness, tingling, or loss of feeling (numbness) along the sciatic nerve. The sciatic nerve starts in the lower back and goes down the back of each leg. Sciatica usually goes away on its own or with treatment. Sometimes, sciatica may come back (recur). What are the causes? This condition happens when the sciatic nerve is pinched or has pressure put on it. This may be the result of:  A disk in between the bones of the spine bulging out too far (herniated disk).  Changes in the spinal disks that occur with aging.  A condition that affects a muscle in the butt.  Extra bone growth near the sciatic nerve.  A break (fracture) of the area between your hip bones (pelvis).  Pregnancy.  Tumor. This is rare. What increases the risk? You are more likely to develop this condition if you:  Play sports that put pressure or stress on the spine.  Have poor strength and ease of movement (flexibility).  Have had a back injury in the past.  Have had back surgery.  Sit for long periods of time.  Do activities that involve bending or lifting over and over again.  Are very overweight (obese). What are the signs or symptoms? Symptoms can vary from mild to very bad. They may include:  Any of these problems in the lower back, leg, hip, or butt: ? Mild tingling, loss of feeling, or dull aches. ? Burning sensations. ? Sharp pains.  Loss of feeling in the back of the calf or the sole of the foot.  Leg weakness.  Very bad back pain that makes it hard to move. These symptoms may get worse when you cough, sneeze, or laugh. They may also get worse when you sit or stand for long periods of time. How is this treated? This condition often gets better  without any treatment. However, treatment may include:  Changing or cutting back on physical activity when you have pain.  Doing exercises and stretching.  Putting ice or heat on the affected area.  Medicines that help: ? To relieve pain and swelling. ? To relax your muscles.  Shots (injections) of medicines that help to relieve pain, irritation, and swelling.  Surgery. Follow these instructions at home: Medicines  Take over-the-counter and prescription medicines only as told by your doctor.  Ask your doctor if the medicine prescribed to you: ? Requires you to avoid driving or using heavy machinery. ? Can cause trouble pooping (constipation). You may need to take these steps to prevent or treat trouble pooping:  Drink enough fluids to keep your pee (urine) pale yellow.  Take over-the-counter or prescription medicines.  Eat foods that are high in fiber. These include beans, whole grains, and fresh fruits and vegetables.  Limit foods that are high in fat and sugar. These include fried or sweet foods. Managing pain      If told, put ice on the affected area. ? Put ice in a plastic bag. ? Place a towel between your skin and the bag. ? Leave the ice on for 20 minutes, 2-3 times a day.  If told, put heat on the affected area. Use the heat source that your doctor tells you to use, such as a  moist heat pack or a heating pad. ? Place a towel between your skin and the heat source. ? Leave the heat on for 20-30 minutes. ? Remove the heat if your skin turns bright red. This is very important if you are unable to feel pain, heat, or cold. You may have a greater risk of getting burned. Activity   Return to your normal activities as told by your doctor. Ask your doctor what activities are safe for you.  Avoid activities that make your symptoms worse.  Take short rests during the day. ? When you rest for a long time, do some physical activity or stretching between periods of  rest. ? Avoid sitting for a long time without moving. Get up and move around at least one time each hour.  Exercise and stretch regularly, as told by your doctor.  Do not lift anything that is heavier than 10 lb (4.5 kg) while you have symptoms of sciatica. ? Avoid lifting heavy things even when you do not have symptoms. ? Avoid lifting heavy things over and over.  When you lift objects, always lift in a way that is safe for your body. To do this, you should: ? Bend your knees. ? Keep the object close to your body. ? Avoid twisting. General instructions  Stay at a healthy weight.  Wear comfortable shoes that support your feet. Avoid wearing high heels.  Avoid sleeping on a mattress that is too soft or too hard. You might have less pain if you sleep on a mattress that is firm enough to support your back.  Keep all follow-up visits as told by your doctor. This is important. Contact a doctor if:  You have pain that: ? Wakes you up when you are sleeping. ? Gets worse when you lie down. ? Is worse than the pain you have had in the past. ? Lasts longer than 4 weeks.  You lose weight without trying. Get help right away if:  You cannot control when you pee (urinate) or poop (have a bowel movement).  You have weakness in any of these areas and it gets worse: ? Lower back. ? The area between your hip bones. ? Butt. ? Legs.  You have redness or swelling of your back.  You have a burning feeling when you pee. Summary  Sciatica is pain, weakness, tingling, or loss of feeling (numbness) along the sciatic nerve.  This condition happens when the sciatic nerve is pinched or has pressure put on it.  Sciatica can cause pain, tingling, or loss of feeling (numbness) in the lower back, legs, hips, and butt.  Treatment often includes rest, exercise, medicines, and putting ice or heat on the affected area. This information is not intended to replace advice given to you by your health  care provider. Make sure you discuss any questions you have with your health care provider. Document Revised: 12/26/2018 Document Reviewed: 12/26/2018 Elsevier Patient Education  [place].   Sciatica Rehab Ask your health care provider which exercises are safe for you. Do exercises exactly as told by your health care provider and adjust them as directed. It is normal to feel mild stretching, pulling, tightness, or discomfort as you do these exercises. Stop right away if you feel sudden pain or your pain gets worse. Do not begin these exercises until told by your health care provider. Stretching and range-of-motion exercises These exercises warm up your muscles and joints and improve the movement and flexibility of your hips and  back. These exercises also help to relieve pain, numbness, and tingling. Sciatic nerve glide 1. Sit in a chair with your head facing down toward your chest. Place your hands behind your back. Let your shoulders slump forward. 2. Slowly straighten one of your legs while you tilt your head back as if you are looking toward the ceiling. Only straighten your leg as far as you can without making your symptoms worse. 3. Hold this position for __________ seconds. 4. Slowly return to the starting position. 5. Repeat with your other leg. Repeat __________ times. Complete this exercise __________ times a day. Knee to chest with hip adduction and internal rotation  1. Lie on your back on a firm surface with both legs straight. 2. Bend one of your knees and move it up toward your chest until you feel a gentle stretch in your lower back and buttock. Then, move your knee toward the shoulder that is on the opposite side from your leg. This is hip adduction and internal rotation. ? Hold your leg in this position by holding on to the front of your knee. 3. Hold this position for __________ seconds. 4. Slowly return to the starting position. 5. Repeat with your other  leg. Repeat __________ times. Complete this exercise __________ times a day. Prone extension on elbows  1. Lie on your abdomen on a firm surface. A bed may be too soft for this exercise. 2. Prop yourself up on your elbows. 3. Use your arms to help lift your chest up until you feel a gentle stretch in your abdomen and your lower back. ? This will place some of your body weight on your elbows. If this is uncomfortable, try stacking pillows under your chest. ? Your hips should stay down, against the surface that you are lying on. Keep your hip and back muscles relaxed. 4. Hold this position for __________ seconds. 5. Slowly relax your upper body and return to the starting position. Repeat __________ times. Complete this exercise __________ times a day. Strengthening exercises These exercises build strength and endurance in your back. Endurance is the ability to use your muscles for a long time, even after they get tired. Pelvic tilt This exercise strengthens the muscles that lie deep in the abdomen. 1. Lie on your back on a firm surface. Bend your knees and keep your feet flat on the floor. 2. Tense your abdominal muscles. Tip your pelvis up toward the ceiling and flatten your lower back into the floor. ? To help with this exercise, you may place a small towel under your lower back and try to push your back into the towel. 3. Hold this position for __________ seconds. 4. Let your muscles relax completely before you repeat this exercise. Repeat __________ times. Complete this exercise __________ times a day. Alternating arm and leg raises  1. Get on your hands and knees on a firm surface. If you are on a hard floor, you may want to use padding, such as an exercise mat, to cushion your knees. 2. Line up your arms and legs. Your hands should be directly below your shoulders, and your knees should be directly below your hips. 3. Lift your left leg behind you. At the same time, raise your right arm  and straighten it in front of you. ? Do not lift your leg higher than your hip. ? Do not lift your arm higher than your shoulder. ? Keep your abdominal and back muscles tight. ? Keep your hips facing the  ground. ? Do not arch your back. ? Keep your balance carefully, and do not hold your breath. 4. Hold this position for __________ seconds. 5. Slowly return to the starting position. 6. Repeat with your right leg and your left arm. Repeat __________ times. Complete this exercise __________ times a day. Posture and body mechanics Good posture and healthy body mechanics can help to relieve stress in your body's tissues and joints. Body mechanics refers to the movements and positions of your body while you do your daily activities. Posture is part of body mechanics. Good posture means:  Your spine is in its natural S-curve position (neutral).  Your shoulders are pulled back slightly.  Your head is not tipped forward. Follow these guidelines to improve your posture and body mechanics in your everyday activities. Standing   When standing, keep your spine neutral and your feet about hip width apart. Keep a slight bend in your knees. Your ears, shoulders, and hips should line up.  When you do a task in which you stand in one place for a long time, place one foot up on a stable object that is 2-4 inches (5-10 cm) high, such as a footstool. This helps keep your spine neutral. Sitting   When sitting, keep your spine neutral and keep your feet flat on the floor. Use a footrest, if necessary, and keep your thighs parallel to the floor. Avoid rounding your shoulders, and avoid tilting your head forward.  When working at a desk or a computer, keep your desk at a height where your hands are slightly lower than your elbows. Slide your chair under your desk so you are close enough to maintain good posture.  When working at a computer, place your monitor at a height where you are looking straight ahead  and you do not have to tilt your head forward or downward to look at the screen. Resting  When lying down and resting, avoid positions that are most painful for you.  If you have pain with activities such as sitting, bending, stooping, or squatting, lie in a position in which your body does not bend very much. For example, avoid curling up on your side with your arms and knees near your chest (fetal position).  If you have pain with activities such as standing for a long time or reaching with your arms, lie with your spine in a neutral position and bend your knees slightly. Try the following positions: ? Lying on your side with a pillow between your knees. ? Lying on your back with a pillow under your knees. Lifting   When lifting objects, keep your feet at least shoulder width apart and tighten your abdominal muscles.  Bend your knees and hips and keep your spine neutral. It is important to lift using the strength of your legs, not your back. Do not lock your knees straight out.  Always ask for help to lift heavy or awkward objects. This information is not intended to replace advice given to you by your health care provider. Make sure you discuss any questions you have with your health care provider. Document Revised: 03/31/2019 Document Reviewed: 12/29/2018 Elsevier Patient Education  [place].

## 2020-11-28 ENCOUNTER — Encounter: Payer: Self-pay | Admitting: Family Medicine

## 2020-11-28 LAB — CYTOLOGY - PAP
Comment: NEGATIVE
Diagnosis: UNDETERMINED — AB
High risk HPV: NEGATIVE

## 2020-11-29 ENCOUNTER — Encounter: Payer: Self-pay | Admitting: Family Medicine

## 2020-11-29 DIAGNOSIS — R87610 Atypical squamous cells of undetermined significance on cytologic smear of cervix (ASC-US): Secondary | ICD-10-CM | POA: Insufficient documentation

## 2020-12-21 DIAGNOSIS — N200 Calculus of kidney: Secondary | ICD-10-CM

## 2020-12-21 HISTORY — DX: Calculus of kidney: N20.0

## 2021-01-30 ENCOUNTER — Ambulatory Visit: Payer: BC Managed Care – PPO | Admitting: Family Medicine

## 2021-01-31 ENCOUNTER — Ambulatory Visit: Payer: BC Managed Care – PPO | Admitting: Family Medicine

## 2021-10-14 IMAGING — US US ABDOMEN COMPLETE
1 series · 14 of 25 positions shown · non-contrast
Comparison: None.

CLINICAL DATA: Epigastric abdominal pain. Elevated liver function
tests.

EXAM:
ABDOMEN ULTRASOUND COMPLETE

[Series 1: us abdomen complete · 14 of 81 slices shown]
[im 1/81]
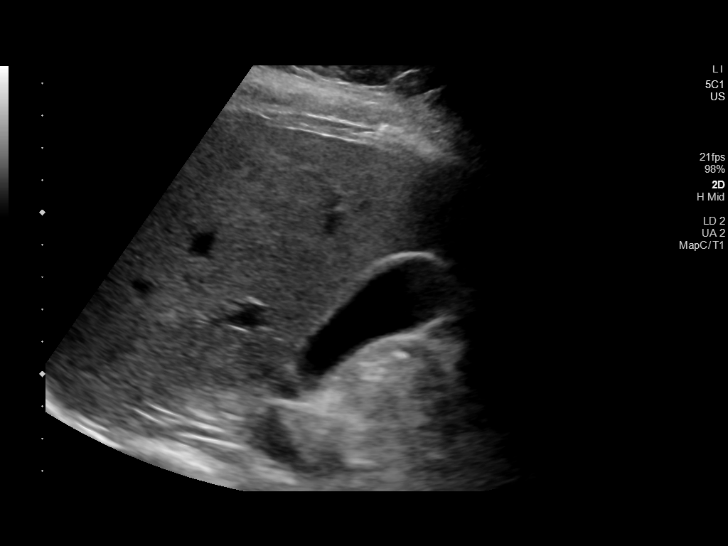
[im 7/81]
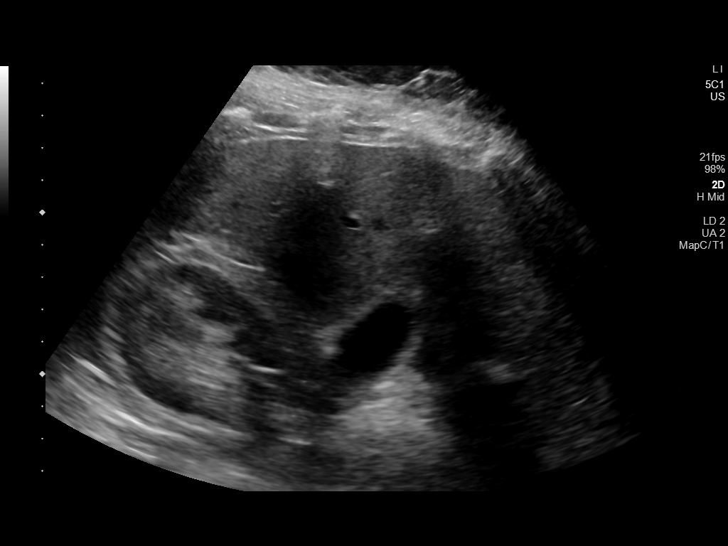
[im 14/81]
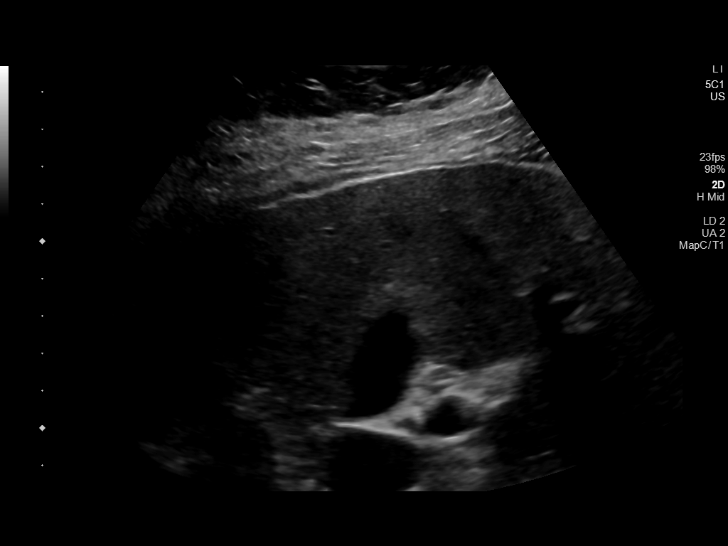
[im 21/81]
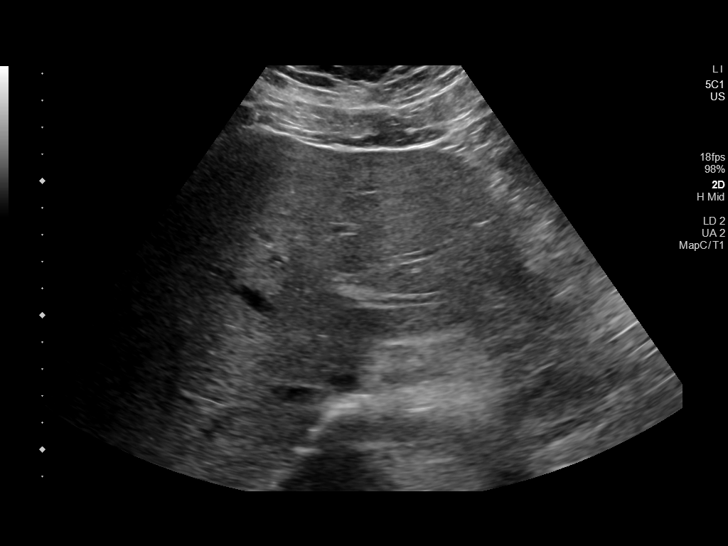
[im 27/81]
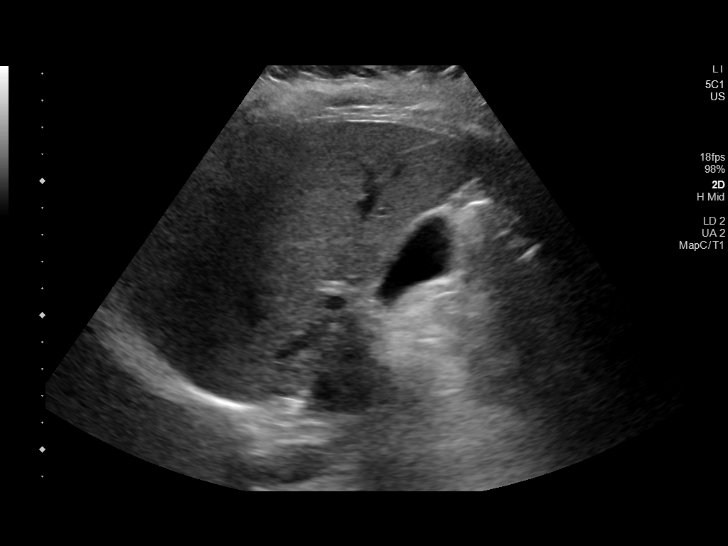
[im 31/81]
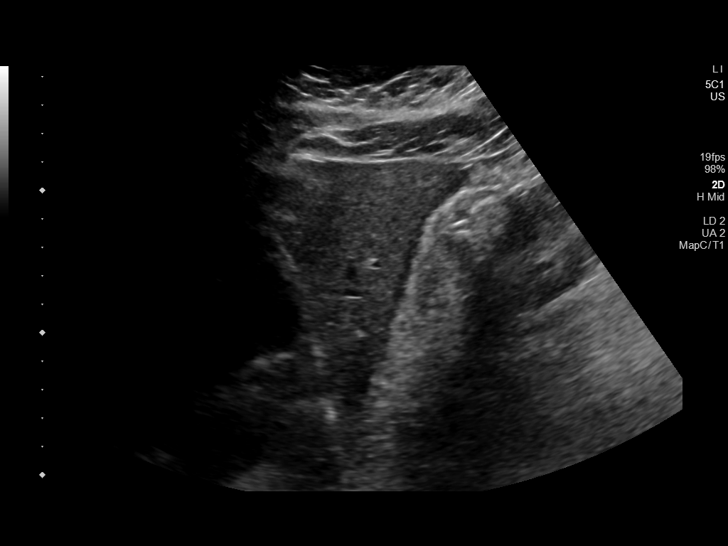
[im 37/81]
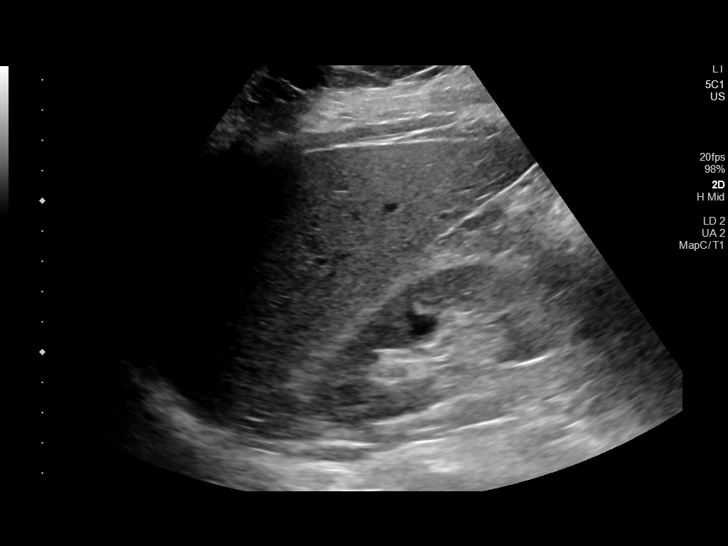
[im 44/81]
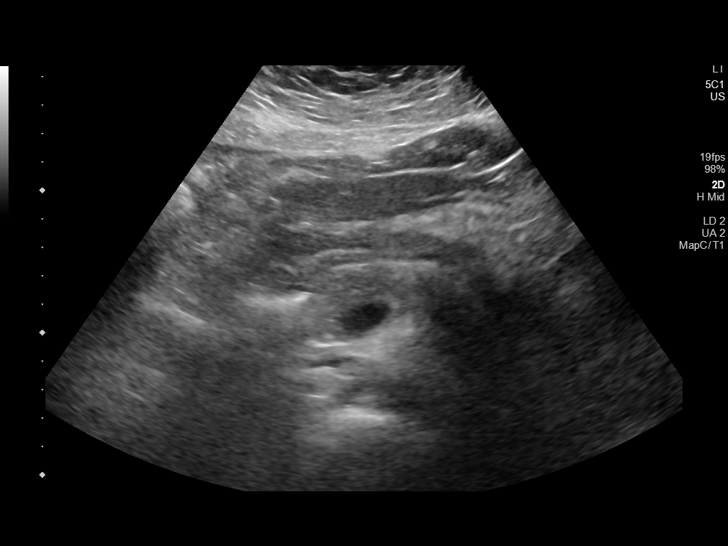
[im 51/81]
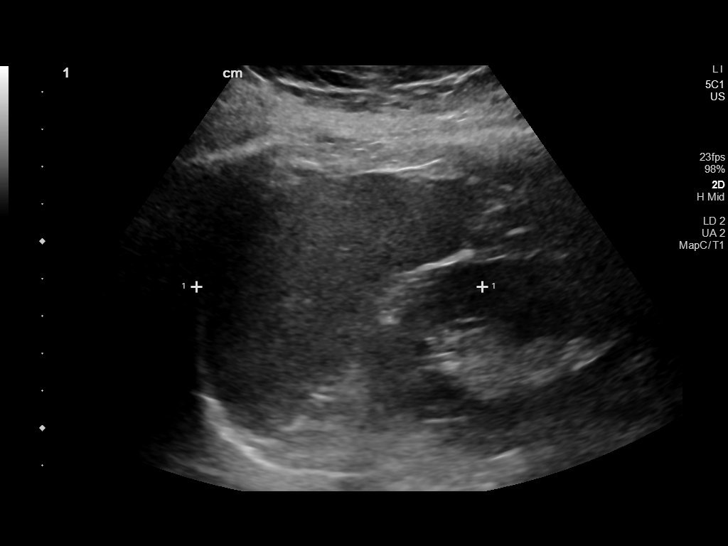
[im 54/81]
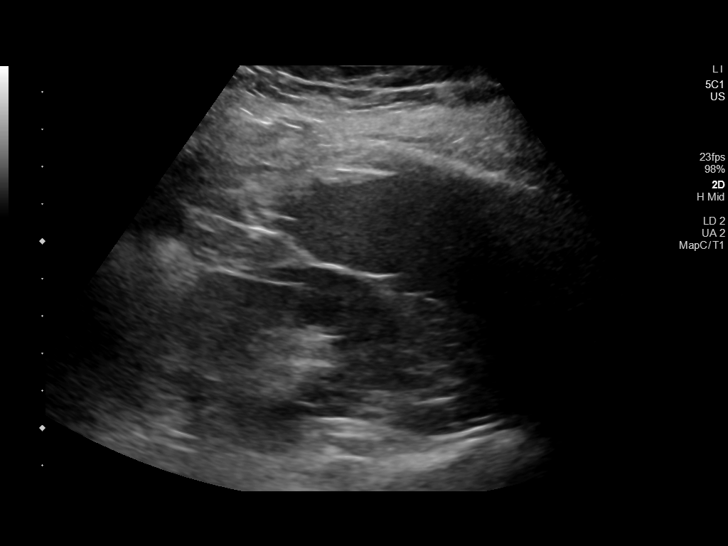
[im 61/81]
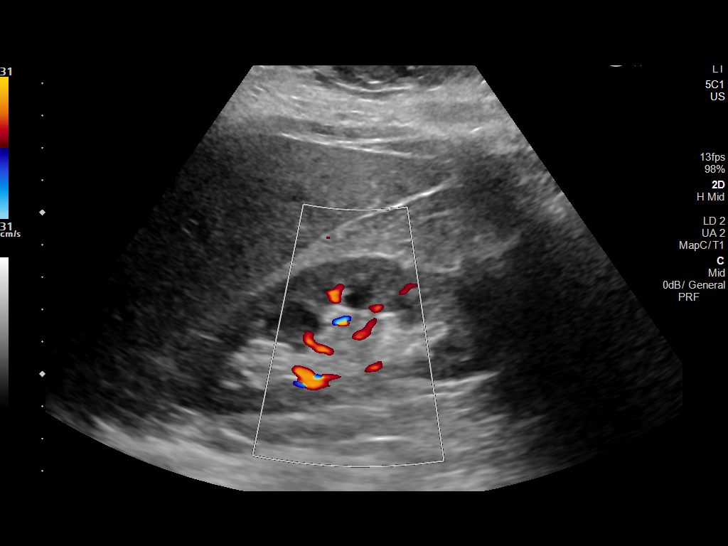
[im 67/81]
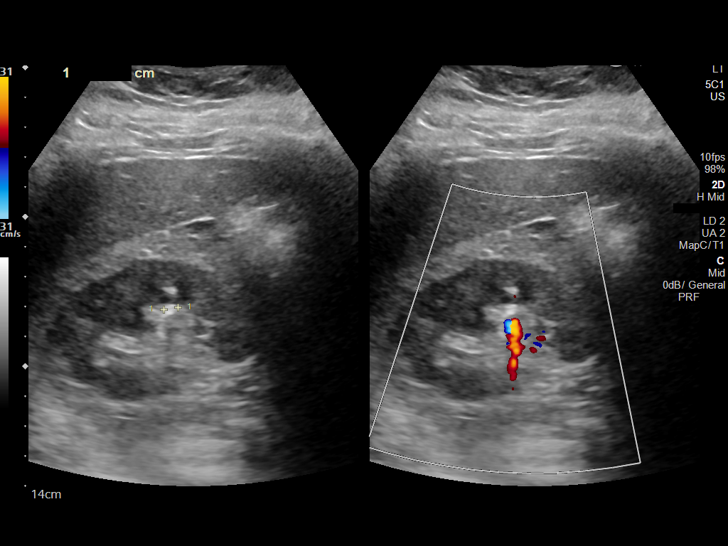
[im 74/81]
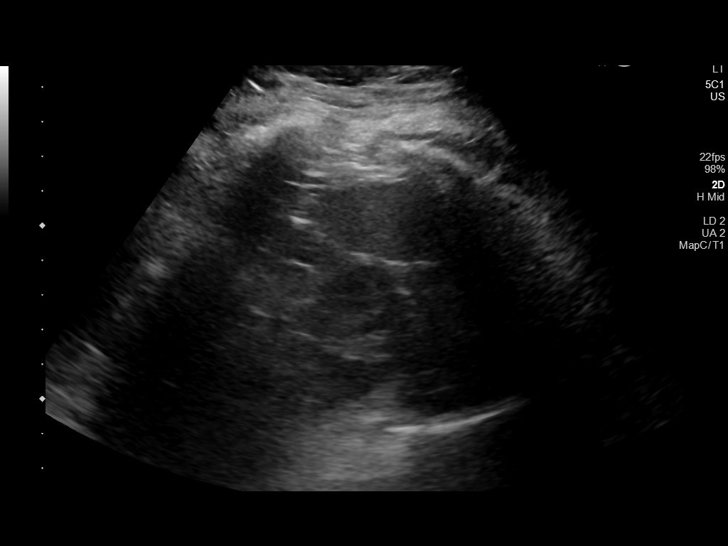
[im 81/81]
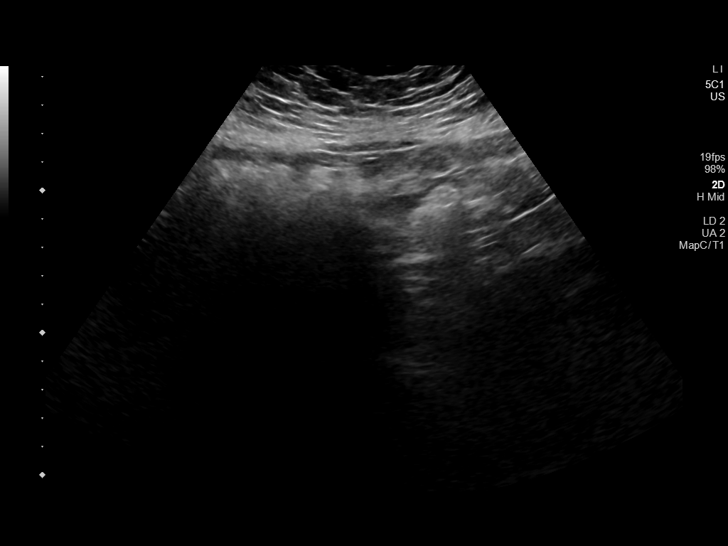

[14 of 25 positions shown; findings below may reference images not displayed]

FINDINGS: Gallbladder: No gallstones or wall thickening visualized. No
sonographic Murphy sign noted by sonographer.

Common bile duct: Diameter: 4 mm which is within normal limits.

Liver: No focal lesion identified. Within normal limits in
parenchymal echogenicity. Portal vein is patent on color Doppler
imaging with normal direction of blood flow towards the liver.

IVC: No abnormality visualized.

Pancreas: Visualized portion unremarkable.

Spleen: Size and appearance within normal limits.

Right Kidney: Length: 11.4 cm. Probable 7 mm nonobstructive calculus
seen in midpole. Echogenicity within normal limits. No mass or
hydronephrosis visualized.

Left Kidney: Length: 11.6 cm. Echogenicity within normal limits. No
mass or hydronephrosis visualized.

Abdominal aorta: No aneurysm visualized.

Other findings: None.
IMPRESSION: Probable nonobstructive right renal calculus. No other abnormality
seen in the abdomen.

## 2021-11-02 ENCOUNTER — Other Ambulatory Visit: Payer: Self-pay

## 2021-11-02 ENCOUNTER — Emergency Department (INDEPENDENT_AMBULATORY_CARE_PROVIDER_SITE_OTHER)
Admission: RE | Admit: 2021-11-02 | Discharge: 2021-11-02 | Disposition: A | Discharge status: Home / Self Care | Payer: BC Managed Care – PPO | Source: Ambulatory Visit | Attending: Family Medicine | Admitting: Family Medicine

## 2021-11-02 VITALS — BP 115/81 | HR 76 | Temp 97.9°F | Resp 16

## 2021-11-02 DIAGNOSIS — R079 Chest pain, unspecified: Secondary | ICD-10-CM | POA: Diagnosis not present

## 2021-11-02 NOTE — ED Provider Notes (Signed)
[facility]    CSN: [id number] Arrival date & time: 11/02/21  1059      History   Chief Complaint Chief Complaint  Patient presents with   Chest Pain    HPI Breanna Byrd is a 40 y.o. female.   HPI  Patient is here for chest pain.  It is in the left side of her chest.  It is intermittent.  It is random.  It feels like a pressure sensation.  It has been present off and on for 2 years.  When in [place] she had this evaluated.  She went to her cardiologist.  They told her there was nothing wrong with her heart.  She knows she has high cholesterol.  She requests a cholesterol today.  This is not provided because she is not fasting, and a cholesterol 1 year ago is available on the chart, she is made no change in diet or exercise to expect a change in her cholesterol. Patient has no known heart disease.  No hypertension, no diabetes, no cigarette smoking no family history of heart disease, no exertional pain Patient has right upper quadrant pain periodically but is told this is a gallstone She does not have any heartburn or stomach problems.  No lung or breathing problems.  No recent illness or cough.  History reviewed. No pertinent past medical history.  Patient Active Problem List   Diagnosis Date Noted   ASCUS of cervix with negative high risk HPV 11/29/2020   Hyperlipidemia, mixed 08/23/2020   B12 deficiency 08/23/2020   Vitamin D deficiency 08/23/2020    History reviewed. No pertinent surgical history.  OB History   No obstetric history on file.      Home Medications    Prior to Admission medications   Medication Sig Start Date End Date Taking? Authorizing Provider  gabapentin (NEURONTIN) 300 MG capsule Take 1 capsule (300 mg total) by mouth 3 (three) times daily as needed. 11/22/20   Orma Flaming, MD  Vitamin D, Ergocalciferol, (DRISDOL) 1.25 MG (50000 UNIT) CAPS capsule One capsule by mouth once a week for 12 weeks. Then take 2000IU/day 08/28/20    Orma Flaming, MD    Family History History reviewed. No pertinent family history.  Social History Social History   Tobacco Use   Smoking status: Never   Smokeless tobacco: Never  Substance Use Topics   Alcohol use: Not Currently    Comment: Pt's husband says 1 beer weekly   Drug use: Never     Allergies   Patient has no known allergies.   Review of Systems Review of Systems See HPI  Physical Exam Triage Vital Signs ED Triage Vitals  Enc Vitals Group     BP 11/02/21 1111 115/81     Pulse Rate 11/02/21 1111 76     Resp 11/02/21 1111 16     Temp 11/02/21 1111 97.9 F (36.6 C)     Temp Source 11/02/21 1111 Oral     SpO2 11/02/21 1111 98 %     Weight --      Height --      Head Circumference --      Peak Flow --      Pain Score 11/02/21 1110 8     Pain Loc --      Pain Edu? --      Excl. in [place]? --    No data found.  Updated Vital Signs BP 115/81 (BP Location: Left Arm)   Pulse 76  Temp 97.9 F (36.6 C) (Oral)   Resp 16   SpO2 98%      Physical Exam Constitutional:      General: She is not in acute distress.    Appearance: She is well-developed. She is not ill-appearing.     Comments: Overweight  HENT:     Head: Normocephalic and atraumatic.  Eyes:     Conjunctiva/sclera: Conjunctivae normal.     Pupils: Pupils are equal, round, and reactive to light.  Cardiovascular:     Rate and Rhythm: Normal rate and regular rhythm.     Heart sounds: Normal heart sounds. No murmur heard. No diastolic murmur is present.    No friction rub. No gallop. No S3 or S4 sounds.  Pulmonary:     Effort: Pulmonary effort is normal. No respiratory distress.     Breath sounds: Normal breath sounds.  Abdominal:     General: There is no distension.     Palpations: Abdomen is soft.  Musculoskeletal:        General: Normal range of motion.     Cervical back: Normal range of motion.     Right lower leg: No tenderness. No edema.     Left lower leg: No tenderness. No  edema.  Skin:    General: Skin is warm and dry.  Neurological:     General: No focal deficit present.     Mental Status: She is alert.  Psychiatric:        Mood and Affect: Mood normal.        Behavior: Behavior normal.     UC Treatments / Results  Labs (all labs ordered are listed, but only abnormal results are displayed) Labs Reviewed - No data to display  EKG Unchanged from prior normal sinus rhythm rate 75 normal intervals no ST or T wave changes  Radiology No results found.  Procedures Procedures (including critical care time)  Medications Ordered in UC Medications - No data to display  Initial Impression / Assessment and Plan / UC Course  I have reviewed the triage vital signs and the nursing notes.  Pertinent labs & imaging results that were available during my care of the patient were reviewed by me and considered in my medical decision making (see chart for details).     Explained to the patient that chest pain is a complicatedMedical problems work-up.  All we can do at the urgent care center respiratory she does not have acute EKG changes, take a good history and doing physical.  Cannot do blood work.  Cannot check all the other causes of chest pain.  She is to follow-up with her primary care doctor.If she feels acutely she may be having any heart problems, she must go to the emergency room.  Since this problems been going on for 2 years and is largely unchanged except for having it more frequently in the last couple of weeks, patient declines ER visit today.  She denies stress although I think this may contribute Final Clinical Impressions(s) / UC Diagnoses   Final diagnoses:  Nonspecific chest pain     Discharge Instructions      Your EKG is normal Make sure that you try to eat healthy, getting exercise, get plenty of sleep Follow-up with your primary care doctor for any additional testing  For chest pain that is severe, especially if accompanied by pain  into neck and arm, nausea, irregular heartbeats, dizziness, go to closest emergency room   ED Prescriptions  None    PDMP not reviewed this encounter.   Raylene Everts, MD 11/02/21 [phone]

## 2021-11-02 NOTE — ED Triage Notes (Signed)
Pt present chest pain the last week, symptoms started a week ago. Pt states the chest pain gets tightness.

## 2021-11-02 NOTE — Discharge Instructions (Signed)
Your EKG is normal Make sure that you try to eat healthy, getting exercise, get plenty of sleep Follow-up with your primary care doctor for any additional testing  For chest pain that is severe, especially if accompanied by pain into neck and arm, nausea, irregular heartbeats, dizziness, go to closest emergency room

## 2021-11-20 ENCOUNTER — Encounter: Payer: BC Managed Care – PPO | Admitting: Family Medicine

## 2021-11-26 DIAGNOSIS — Z1231 Encounter for screening mammogram for malignant neoplasm of breast: Secondary | ICD-10-CM | POA: Diagnosis not present

## 2021-11-26 DIAGNOSIS — Z6838 Body mass index (BMI) 38.0-38.9, adult: Secondary | ICD-10-CM | POA: Diagnosis not present

## 2021-11-26 DIAGNOSIS — R87610 Atypical squamous cells of undetermined significance on cytologic smear of cervix (ASC-US): Secondary | ICD-10-CM | POA: Diagnosis not present

## 2021-11-26 DIAGNOSIS — N200 Calculus of kidney: Secondary | ICD-10-CM | POA: Diagnosis not present

## 2021-11-26 DIAGNOSIS — Z01419 Encounter for gynecological examination (general) (routine) without abnormal findings: Secondary | ICD-10-CM | POA: Diagnosis not present

## 2021-11-27 ENCOUNTER — Other Ambulatory Visit: Payer: Self-pay | Admitting: Obstetrics & Gynecology

## 2021-11-27 DIAGNOSIS — N200 Calculus of kidney: Secondary | ICD-10-CM

## 2021-11-28 ENCOUNTER — Encounter: Payer: BC Managed Care – PPO | Admitting: Family Medicine

## 2021-12-05 DIAGNOSIS — Z0000 Encounter for general adult medical examination without abnormal findings: Secondary | ICD-10-CM | POA: Diagnosis not present

## 2021-12-05 DIAGNOSIS — Z1329 Encounter for screening for other suspected endocrine disorder: Secondary | ICD-10-CM | POA: Diagnosis not present

## 2021-12-11 ENCOUNTER — Encounter: Payer: BC Managed Care – PPO | Admitting: Physician Assistant

## 2021-12-19 ENCOUNTER — Other Ambulatory Visit: Payer: Self-pay

## 2021-12-19 ENCOUNTER — Ambulatory Visit
Admission: RE | Admit: 2021-12-19 | Discharge: 2021-12-19 | Disposition: A | Discharge status: Home / Self Care | Payer: BC Managed Care – PPO | Source: Ambulatory Visit | Attending: Obstetrics & Gynecology | Admitting: Obstetrics & Gynecology

## 2021-12-19 DIAGNOSIS — N200 Calculus of kidney: Secondary | ICD-10-CM | POA: Diagnosis not present

## 2021-12-21 HISTORY — PX: COLONOSCOPY: SHX174

## 2022-01-05 DIAGNOSIS — Z03818 Encounter for observation for suspected exposure to other biological agents ruled out: Secondary | ICD-10-CM | POA: Diagnosis not present

## 2022-01-05 DIAGNOSIS — Z20822 Contact with and (suspected) exposure to covid-19: Secondary | ICD-10-CM | POA: Diagnosis not present

## 2022-01-05 DIAGNOSIS — R051 Acute cough: Secondary | ICD-10-CM | POA: Diagnosis not present

## 2022-08-10 DIAGNOSIS — M545 Low back pain, unspecified: Secondary | ICD-10-CM | POA: Diagnosis not present

## 2022-08-10 DIAGNOSIS — D259 Leiomyoma of uterus, unspecified: Secondary | ICD-10-CM | POA: Diagnosis not present

## 2022-08-12 ENCOUNTER — Ambulatory Visit (HOSPITAL_COMMUNITY)
Admission: EM | Admit: 2022-08-12 | Discharge: 2022-08-12 | Disposition: A | Discharge status: Home / Self Care | Payer: BC Managed Care – PPO | Attending: Physician Assistant | Admitting: Physician Assistant

## 2022-08-12 ENCOUNTER — Other Ambulatory Visit: Payer: Self-pay | Admitting: General Surgery

## 2022-08-12 ENCOUNTER — Encounter (HOSPITAL_COMMUNITY): Payer: Self-pay | Admitting: *Deleted

## 2022-08-12 DIAGNOSIS — K388 Other specified diseases of appendix: Secondary | ICD-10-CM

## 2022-08-12 DIAGNOSIS — B9789 Other viral agents as the cause of diseases classified elsewhere: Secondary | ICD-10-CM | POA: Diagnosis not present

## 2022-08-12 DIAGNOSIS — R509 Fever, unspecified: Secondary | ICD-10-CM | POA: Diagnosis not present

## 2022-08-12 DIAGNOSIS — R059 Cough, unspecified: Secondary | ICD-10-CM | POA: Diagnosis not present

## 2022-08-12 DIAGNOSIS — R051 Acute cough: Secondary | ICD-10-CM | POA: Diagnosis not present

## 2022-08-12 DIAGNOSIS — J069 Acute upper respiratory infection, unspecified: Secondary | ICD-10-CM | POA: Diagnosis not present

## 2022-08-12 DIAGNOSIS — Z20822 Contact with and (suspected) exposure to covid-19: Secondary | ICD-10-CM | POA: Insufficient documentation

## 2022-08-12 LAB — RESP PANEL BY RT-PCR (FLU A&B, COVID) ARPGX2
Influenza A by PCR: NEGATIVE
Influenza B by PCR: NEGATIVE
SARS Coronavirus 2 by RT PCR: NEGATIVE

## 2022-08-12 NOTE — ED Provider Notes (Signed)
[facility]    CSN: [id number] Arrival date & time: 08/12/22  1309      History   Chief Complaint Chief Complaint  Patient presents with   Cough    HPI Breanna Byrd is a 41 y.o. female.   41 year old female presents with fever, cough, body aches.  Patient indicates that last week she was in [place].  She started getting sick having some mild respiratory symptoms.  When she returned back to the [place] several days ago she noticed that her symptoms continued and have worsened.  She relates having cough, chest congestion, rhinitis with clear to white production.  Patient indicates that she has had fever for the past 2 days which has been mild, with body aches, pain, and soreness.  Patient relates that she has been taking some Mucinex and Tylenol which has helped to control her symptoms.  She indicates she did a COVID test yesterday which was negative.  She relates being vaccinated against COVID.  He has not have any nausea or vomiting. 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   Cough   History reviewed. No pertinent past medical history.  Patient Active Problem List   Diagnosis Date Noted   ASCUS of cervix with negative high risk HPV 11/29/2020   Hyperlipidemia, mixed 08/23/2020   B12 deficiency 08/23/2020   Vitamin D deficiency 08/23/2020    History reviewed. No pertinent surgical history.  OB History   No obstetric history on file.      Home Medications    Prior to Admission medications   Medication Sig Start Date End Date Taking? Authorizing Provider  gabapentin (NEURONTIN) 300 MG capsule Take 1 capsule (300 mg total) by mouth 3 (three) times daily as needed. 11/22/20   Orma Flaming, MD  Vitamin D, Ergocalciferol, (DRISDOL) 1.25 MG (50000 UNIT) CAPS capsule One capsule by mouth once a week for 12 weeks. Then take 2000IU/day 08/28/20   Orma Flaming, MD    Family  History Family History  Problem Relation Age of Onset   Healthy Mother    Diabetes Father     Social History Social History   Tobacco Use   Smoking status: Never   Smokeless tobacco: Never  Substance Use Topics   Alcohol use: Not Currently    Comment: Pt's husband says 1 beer weekly   Drug use: Never     Allergies   Patient has no known allergies.   Review of Systems Review of Systems  Respiratory:  Positive for cough.      Physical Exam Triage Vital Signs ED Triage Vitals  Enc Vitals Group     BP 08/12/22 1402 115/66     Pulse Rate 08/12/22 1402 (!) 101     Resp 08/12/22 1402 18     Temp 08/12/22 1402 (!) 100.6 F (38.1 C)     Temp Source 08/12/22 1402 Oral     SpO2 08/12/22 1402 99 %     Weight --      Height --      Head Circumference --      Peak Flow --      Pain Score 08/12/22 1400 6     Pain Loc --      Pain Edu? --      Excl. in [place]? --    No data found.  Updated Vital Signs BP 115/66 (BP Location: Right Arm)   Pulse Marland Kitchen)  101   Temp (!) 100.6 F (38.1 C) (Oral)   Resp 18   LMP 08/07/2022 (Approximate)   SpO2 99%   Visual Acuity Right Eye Distance:   Left Eye Distance:   Bilateral Distance:    Right Eye Near:   Left Eye Near:    Bilateral Near:     Physical Exam Constitutional:      Appearance: Normal appearance.  HENT:     Right Ear: Ear canal normal. Tympanic membrane is injected.     Left Ear: Ear canal normal. Tympanic membrane is injected.     Mouth/Throat:     Mouth: Mucous membranes are moist.     Pharynx: Oropharynx is clear. No pharyngeal swelling or posterior oropharyngeal erythema.  Cardiovascular:     Rate and Rhythm: Normal rate and regular rhythm.     Heart sounds: Normal heart sounds.  Pulmonary:     Effort: Pulmonary effort is normal.     Breath sounds: Normal breath sounds and air entry. No wheezing, rhonchi or rales.  Abdominal:     General: Abdomen is flat. Bowel sounds are normal.     Palpations: Abdomen  is soft.     Tenderness: There is generalized abdominal tenderness.  Lymphadenopathy:     Cervical: No cervical adenopathy.  Neurological:     Mental Status: She is alert.      UC Treatments / Results  Labs (all labs ordered are listed, but only abnormal results are displayed) Labs Reviewed  RESP PANEL BY RT-PCR (FLU A&B, COVID) [id number]    EKG   Radiology No results found.  Procedures Procedures (including critical care time)  Medications Ordered in UC Medications - No data to display  Initial Impression / Assessment and Plan / UC Course  I have reviewed the triage vital signs and the nursing notes.  Pertinent labs & imaging results that were available during my care of the patient were reviewed by me and considered in my medical decision making (see chart for details).    Plan: 1.  COVID and flu test is pending. 2. Advised to continue with Mucinex for chest congestion and cough and taken Tylenol or ibuprofen for fever. 3.  Advised to follow-up with PCP or return to urgent care as needed. Final Clinical Impressions(s) / UC Diagnoses   Final diagnoses:  Acute cough  Viral upper respiratory tract infection  Fever, unspecified     Discharge Instructions      Advised to continue with Mucinex to help control cough and congestion. Advised take Tylenol or ibuprofen for aches pains and discomfort. COVID and flu test should be completed within the next 24 hours.  If you do not get a call from this office that indicates the test are negative.  You can go on MyChart to review the results when they post in 24 hours or less. Advised to follow-up PCP or return to urgent care if symptoms fail to improve.    ED Prescriptions   None    PDMP not reviewed this encounter.   Nyoka Lint, PA-C 08/12/22 1440

## 2022-08-12 NOTE — Discharge Instructions (Addendum)
Advised to continue with Mucinex to help control cough and congestion. Advised take Tylenol or ibuprofen for aches pains and discomfort. COVID and flu test should be completed within the next 24 hours.  If you do not get a call from this office that indicates the test are negative.  You can go on MyChart to review the results when they post in 24 hours or less. Advised to follow-up PCP or return to urgent care if symptoms fail to improve.

## 2022-08-12 NOTE — ED Triage Notes (Signed)
Patient states that she has had a cough 4-5 days and yesterday has a fever. She is feeling very tired. Did a at home test for COVID last night and it was Neg. She is also have abdominal pain which started yesterday. She too nasal spray and cough med without relief. She does say she is scheduled to have her appendix taken out.

## 2022-08-13 ENCOUNTER — Encounter: Payer: Self-pay | Admitting: Gastroenterology

## 2022-08-14 ENCOUNTER — Encounter: Payer: Self-pay | Admitting: Gastroenterology

## 2022-08-18 ENCOUNTER — Telehealth: Payer: Self-pay

## 2022-08-18 ENCOUNTER — Telehealth: Payer: Self-pay | Admitting: Gastroenterology

## 2022-08-18 NOTE — Telephone Encounter (Signed)
Spoke with the spouse. The patient is scheduled to have a colonoscopy 09/01/22 before appendectomy. Dx is mucocele of the appendix. She has been in the ER for abdominal pain recently. The concern is she will have abdominal pain again. She does not want to go to the ER. This phone call is asking for an earlier colonoscopy date. She was referred to [facility] by Dr Barry Dienes.

## 2022-08-18 NOTE — Telephone Encounter (Signed)
Patients husband called stating his wife needs her appendix removed, has pre visit Thursday but insisted on speaking with nurse to further advise. Thank you

## 2022-08-18 NOTE — Telephone Encounter (Signed)
Dr. Silverio Decamp, This pt is referred by Dr. Barry Dienes for colonoscopy to r/o cecum mass before proceeding with appendix surgery for mucocele.   PV is scheduled for 8/31 with colon scheduled for 9/12.  Ok to proceed for direct colon?  Which ICD-10 code would be appropriate for this colonoscopy?   Thanks, Aaron Edelman (PV)

## 2022-08-19 NOTE — Telephone Encounter (Signed)
Doing the colonoscopy day before appendectomy will help her to not have to go through bowel prep multiple times.

## 2022-08-20 ENCOUNTER — Ambulatory Visit (AMBULATORY_SURGERY_CENTER): Payer: BC Managed Care – PPO

## 2022-08-20 ENCOUNTER — Encounter: Payer: Self-pay | Admitting: Gastroenterology

## 2022-08-20 VITALS — Ht 63.0 in | Wt 225.0 lb

## 2022-08-20 DIAGNOSIS — K388 Other specified diseases of appendix: Secondary | ICD-10-CM

## 2022-08-20 MED ORDER — NA SULFATE-K SULFATE-MG SULF 17.5-3.13-1.6 GM/177ML PO SOLN: 1.0000 | ORAL | 0 refills | Status: DC

## 2022-08-20 NOTE — Progress Notes (Signed)
No egg or soy allergy known to patient  No issues known to pt with past sedation with any surgeries or procedures Patient denies ever being told they had issues or difficulty with intubation  No FH of Malignant Hyperthermia Pt is not on diet pills Pt is not on  home 02  Pt is not on blood thinners  Pt denies issues with constipation  No A fib or A flutter Have any cardiac testing pending--denied Pt instructed to use [employer] or GoodRx for a price reduction on prep   

## 2022-08-20 NOTE — Telephone Encounter (Signed)
Dr Bryan Lemma accepted moving this patient to his schedule in the interest of getting the colonoscopy sooner.  Spoke with the patient's spouse and informed him of the date change and provider.

## 2022-08-20 NOTE — Telephone Encounter (Signed)
Pt is scheduled for PV today. In Dr. Marlowe Aschoff note there is mention of waiting on appendectomy for possible pathology results from colonoscopy.  I will verify appendectomy date with pt today at her visit, but wanted to clarify plan for colonoscopy/appendectomy dates (next day vs 1-2 weeks apart) before discussing with patient.  Also, would Mucocele of appendix K38.8 be appropriate ICD code for this 41 yr old? Thanks, Aaron Edelman (PV)

## 2022-08-26 ENCOUNTER — Encounter: Payer: Self-pay | Admitting: Gastroenterology

## 2022-08-26 ENCOUNTER — Ambulatory Visit (AMBULATORY_SURGERY_CENTER): Payer: BC Managed Care – PPO | Admitting: Gastroenterology

## 2022-08-26 VITALS — BP 121/84 | HR 66 | Temp 98.0°F | Resp 13 | Ht 63.0 in | Wt 225.0 lb

## 2022-08-26 DIAGNOSIS — R933 Abnormal findings on diagnostic imaging of other parts of digestive tract: Secondary | ICD-10-CM

## 2022-08-26 DIAGNOSIS — K640 First degree hemorrhoids: Secondary | ICD-10-CM | POA: Diagnosis not present

## 2022-08-26 DIAGNOSIS — K6289 Other specified diseases of anus and rectum: Secondary | ICD-10-CM | POA: Diagnosis not present

## 2022-08-26 DIAGNOSIS — K388 Other specified diseases of appendix: Secondary | ICD-10-CM

## 2022-08-26 DIAGNOSIS — K644 Residual hemorrhoidal skin tags: Secondary | ICD-10-CM | POA: Diagnosis not present

## 2022-08-26 HISTORY — PX: COLONOSCOPY W/ POLYPECTOMY: SHX1380

## 2022-08-26 MED ORDER — SODIUM CHLORIDE 0.9 % IV SOLN: 500.0000 mL | Freq: Once | INTRAVENOUS | Status: DC

## 2022-08-26 NOTE — Progress Notes (Signed)
PT taken to PACU. Monitors in place. VSS. Report given to RN. 

## 2022-08-26 NOTE — Progress Notes (Signed)
   GASTROENTEROLOGY PROCEDURE H&P NOTE   Primary Care Physician: Pcp, No    Reason for Procedure:  Colon Cancer screening, appendiceal mucocele  Plan:    Colonoscopy  Patient is appropriate for endoscopic procedure(s) in the ambulatory ([facility]) setting.  The nature of the procedure, as well as the risks, benefits, and alternatives were carefully and thoroughly reviewed with the patient. Ample time for discussion and questions allowed. The patient understood, was satisfied, and agreed to proceed.     HPI: Breanna Byrd is a 41 y.o. female who presents for colonoscopy for Colon Cancer screening along with endoscopic evaluation for recently diagnosed appendiceal mucoceal. This was diagnosed incidentally on recent MRI pelvis last month. Scheduled for appendectomy later this month (vs right hemicolectomy depending on findings today).  No prior colonoscopy.  Fhx of MGM with colorectal CA.   Past Medical History:  Diagnosis Date   Hyperlipidemia     History reviewed. No pertinent surgical history.  Prior to Admission medications   Not on File    No current outpatient medications on file.   Current Facility-Administered Medications  Medication Dose Route Frequency Provider Last Rate Last Admin   0.9 %  sodium chloride infusion  500 mL Intravenous Once Taliana Mersereau V, DO        Allergies as of 08/26/2022   (No Known Allergies)    Family History  Problem Relation Age of Onset   Healthy Mother    Diabetes Father    Colon cancer Maternal Grandmother    Esophageal cancer Neg Hx    Stomach cancer Neg Hx    Rectal cancer Neg Hx     Social History   Socioeconomic History   Marital status: Married    Spouse name: Not on file   Number of children: Not on file   Years of education: Not on file   Highest education level: Not on file  Occupational History   Not on file  Tobacco Use   Smoking status: Never   Smokeless tobacco: Never  Vaping Use   Vaping Use: Never  used  Substance and Sexual Activity   Alcohol use: Not Currently    Comment: Pt's husband says 1 beer weekly   Drug use: Never   Sexual activity: Yes    Partners: Male  Other Topics Concern   Not on file  Social History Narrative   Not on file   Social Determinants of Health   Financial Resource Strain: Not on file  Food Insecurity: Not on file  Transportation Needs: Not on file  Physical Activity: Not on file  Stress: Not on file  Social Connections: Not on file  Intimate Partner Violence: Not on file    Physical Exam: Vital signs in last 24 hours: '@BP'$  126/71   Pulse 76   Temp 98 F (36.7 C)   Ht '5\' 3"'$  (1.6 m)   Wt 225 lb (102.1 kg)   LMP 08/25/2022 (Approximate)   SpO2 98%   BMI 39.86 kg/m  GEN: NAD EYE: Sclerae anicteric ENT: MMM CV: Non-tachycardic Pulm: CTA b/l GI: Soft, NT/ND NEURO:  Alert & Oriented x 3   Gerrit Heck, DO [facility]   08/26/2022 11:28 AM

## 2022-08-26 NOTE — Progress Notes (Signed)
VS by DT  Pt's states no medical or surgical changes since previsit or office visit.  

## 2022-08-26 NOTE — Progress Notes (Signed)
Called to room to assist during endoscopic procedure.  Patient ID and intended procedure confirmed with present staff. Received instructions for my participation in the procedure from the performing physician.  

## 2022-08-26 NOTE — Op Note (Signed)
[place] Patient Name: Breanna Byrd Procedure Date: 08/26/2022 12:08 PM MRN: [id number] Endoscopist: Gerrit Heck , MD Age: 41 Referring MD:  Date of Birth: 30-Jul-1981 Gender: Female Account #: [id number] Procedure:                Colonoscopy Indications:              Abnormal MRI of the GI tract                           41 y.o. female who presents for colonoscopy for                            Colon Cancer screening along with endoscopic                            evaluation for recently diagnosed appendiceal                            mucoceal. This was diagnosed on recent MRI pelvis                            last month. Was evaluted in the surgical clinic                            with plan for appendectomy vs right hemicolectomy                            depending on findings today. Family history notable                            for maternal grandmother with colorectal cancer. No                            previous colonoscopy. Medicines:                Monitored Anesthesia Care Procedure:                Pre-Anesthesia Assessment:                           - Prior to the procedure, a History and Physical                            was performed, and patient medications and                            allergies were reviewed. The patient's tolerance of                            previous anesthesia was also reviewed. The risks                            and benefits of the procedure and the sedation  options and risks were discussed with the patient.                            All questions were answered, and informed consent                            was obtained. Prior Anticoagulants: The patient has                            taken no previous anticoagulant or antiplatelet                            agents. ASA Grade Assessment: II - A patient with                            mild systemic disease. After reviewing the risks                             and benefits, the patient was deemed in                            satisfactory condition to undergo the procedure.                           After obtaining informed consent, the colonoscope                            was passed under direct vision. Throughout the                            procedure, the patient's blood pressure, pulse, and                            oxygen saturations were monitored continuously. The                            CF [id number] #[id number] was introduced through the anus                            and advanced to the 10 cm into the ileum. The                            colonoscopy was performed without difficulty. The                            patient tolerated the procedure well. The quality                            of the bowel preparation was good. The terminal                            ileum, ileocecal valve, appendiceal orifice, and  rectum were photographed. Scope In: 12:12:26 PM Scope Out: 12:27:45 PM Scope Withdrawal Time: 0 hours 13 minutes 19 seconds  Total Procedure Duration: 0 hours 15 minutes 19 seconds  Findings:                 Skin tags were found on perianal exam.                           The appendiceal orifice was easily located and was                            protruding into the cecal lumen, consistent with                            extaluminal compression. The mucosa was granular                            and slightly nodular on white light and narrow-band                            imaging. Several mucosal biopsies were taken with a                            cold forceps for histology. Estimated blood loss                            was minimal.                           The remainder of the colon otherwise appeared                            normal.                           Non-bleeding internal hemorrhoids and hypertrophied                            anal papillae were found  during retroflexion. The                            hemorrhoids were small.                           The terminal ileum appeared normal. Complications:            No immediate complications. Estimated Blood Loss:     Estimated blood loss was minimal. Impression:               - Perianal skin tags found on perianal exam.                           - Protruding appendiceal orifice consistent with                            extaluminal compression from mucocele noted on MRI.  The protruding mucosa was slightly nodular                            appearing. This was biopsied.                           - The entire examined colon is normal.                           - Non-bleeding internal hemorrhoids.                           - The examined portion of the ileum was normal. Recommendation:           - Patient has a contact number available for                            emergencies. The signs and symptoms of potential                            delayed complications were discussed with the                            patient. Return to normal activities tomorrow.                            Written discharge instructions were provided to the                            patient.                           - Resume previous diet.                           - Continue present medications.                           - Await pathology results.                           - Follow-up with Dr. Barry Dienes in the Surgical Clinic                            to discuss resection options pending pathology                            results from today's procedure.                           - Will coordinate interval for repeat colonoscopy                            based on pathology and planned surgery.                           - Follow-up in  the GI clinic as needed in the                            interim. Gerrit Heck, MD 08/26/2022 12:38:16 PM

## 2022-08-26 NOTE — Patient Instructions (Addendum)
YOU HAD AN ENDOSCOPIC PROCEDURE TODAY AT [facility]:   Refer to the procedure report that was given to you for any specific questions about what was found during the examination.  If the procedure report does not answer your questions, please call your gastroenterologist to clarify.  If you requested that your care partner not be given the details of your procedure findings, then the procedure report has been included in a sealed envelope for you to review at your convenience later.  **Handout given on hemorrhoids**  YOU SHOULD EXPECT: Some feelings of bloating in the abdomen. Passage of more gas than usual.  Walking can help get rid of the air that was put into your GI tract during the procedure and reduce the bloating. If you had a lower endoscopy (such as a colonoscopy or flexible sigmoidoscopy) you may notice spotting of blood in your stool or on the toilet paper. If you underwent a bowel prep for your procedure, you may not have a normal bowel movement for a few days.  Please Note:  You might notice some irritation and congestion in your nose or some drainage.  This is from the oxygen used during your procedure.  There is no need for concern and it should clear up in a day or so.  SYMPTOMS TO REPORT IMMEDIATELY:  Following lower endoscopy (colonoscopy or flexible sigmoidoscopy):  Excessive amounts of blood in the stool  Significant tenderness or worsening of abdominal pains  Swelling of the abdomen that is new, acute  Fever of 100F or higher  For urgent or emergent issues, a gastroenterologist can be reached at any hour by calling [phone]. Do not use MyChart messaging for urgent concerns.    DIET:  We do recommend a small meal at first, but then you may proceed to your regular diet.  Drink plenty of fluids but you should avoid alcoholic beverages for 24 hours.  ACTIVITY:  You should plan to take it easy for the rest of today and you should NOT DRIVE or use heavy  machinery until tomorrow (because of the sedation medicines used during the test).    FOLLOW UP: Our staff will call the number listed on your records the next business day following your procedure.  We will call around 7:15- 8:00 am to check on you and address any questions or concerns that you may have regarding the information given to you following your procedure. If we do not reach you, we will leave a message.  If you develop any symptoms (ie: fever, flu-like symptoms, shortness of breath, cough etc.) before then, please call [phone].  If you test positive for Covid 19 in the 2 weeks post procedure, please call and report this information to [place].    If any biopsies were taken you will be contacted by phone or by letter within the next 1-3 weeks.  Please call us at [phone] if you have not heard about the biopsies in 3 weeks.    SIGNATURES/CONFIDENTIALITY: You and/or your care partner have signed paperwork which will be entered into your electronic medical record.  These signatures attest to the fact that that the information above on your After Visit Summary has been reviewed and is understood.  Full responsibility of the confidentiality of this discharge information lies with you and/or your care-partner.

## 2022-08-27 ENCOUNTER — Telehealth: Payer: Self-pay

## 2022-08-27 NOTE — Telephone Encounter (Signed)
  Follow up Call-     08/26/2022   10:46 AM  Call back number  Post procedure Call Back phone  # [phone]  Permission to leave phone message Yes     Patient questions:  Do you have a fever, pain , or abdominal swelling? No. Pain Score  0 *  Have you tolerated food without any problems? Yes.    Have you been able to return to your normal activities? Yes.    Do you have any questions about your discharge instructions: Diet   No. Medications  No. Follow up visit  No.  Do you have questions or concerns about your Care? No.  Actions: * If pain score is 4 or above: No action needed, pain <4.

## 2022-09-01 ENCOUNTER — Encounter: Payer: BC Managed Care – PPO | Admitting: Gastroenterology

## 2022-09-01 ENCOUNTER — Encounter: Payer: Self-pay | Admitting: Family Medicine

## 2022-09-01 ENCOUNTER — Ambulatory Visit (INDEPENDENT_AMBULATORY_CARE_PROVIDER_SITE_OTHER): Payer: BC Managed Care – PPO | Admitting: Family Medicine

## 2022-09-01 VITALS — BP 112/72 | HR 73 | Temp 98.1°F | Ht 63.0 in | Wt 223.0 lb

## 2022-09-01 DIAGNOSIS — Z23 Encounter for immunization: Secondary | ICD-10-CM

## 2022-09-01 DIAGNOSIS — R7303 Prediabetes: Secondary | ICD-10-CM

## 2022-09-01 DIAGNOSIS — Z6839 Body mass index (BMI) 39.0-39.9, adult: Secondary | ICD-10-CM

## 2022-09-01 DIAGNOSIS — E559 Vitamin D deficiency, unspecified: Secondary | ICD-10-CM | POA: Diagnosis not present

## 2022-09-01 DIAGNOSIS — E782 Mixed hyperlipidemia: Secondary | ICD-10-CM | POA: Diagnosis not present

## 2022-09-01 DIAGNOSIS — E538 Deficiency of other specified B group vitamins: Secondary | ICD-10-CM | POA: Diagnosis not present

## 2022-09-01 NOTE — Progress Notes (Signed)
New Patient Office Visit  Subjective:  Patient ID: Breanna Byrd, female    DOB: [birth year]  Age: 41 y.o. MRN: [id number]  CC:  Chief Complaint  Patient presents with   Establish Care    Need new pcp Discuss cholesterol, dm and weight management Fasting     HPI Breanna Byrd presents for [facility] PreDM-working on diet/exercise-less carbs/sugars.  He feels her diet is not that poor, however having difficulty losing weight and maintaining a specific lifestyle. HLD-working on diet/exercise.  Mom does have hyperlipidemia as well.  Patient is questioning why her cholesterol is high when she has a decent diet. Obesity-walks daily.  If does vigorous exercise-wt better.   Eats egg but no meats.  Had annual in [place] 1 mo ago-colon 1 wk ago. Getting appy in 1 mo.   Past Medical History:  Diagnosis Date   Hyperlipidemia    Kidney stone     History reviewed. No pertinent surgical history.  Family History  Problem Relation Age of Onset   Hyperlipidemia Mother    Healthy Mother    Hypertension Father    Diabetes Father    Colon cancer Maternal Grandmother 19   Esophageal cancer Neg Hx    Stomach cancer Neg Hx    Rectal cancer Neg Hx     Social History   Socioeconomic History   Marital status: Married    Spouse name: Not on file   Number of children: 2   Years of education: Not on file   Highest education level: Not on file  Occupational History   Not on file  Tobacco Use   Smoking status: Never   Smokeless tobacco: Never  Vaping Use   Vaping Use: Never used  Substance and Sexual Activity   Alcohol use: Yes    Comment: Occasionally (average 1 pint of beer   per month)   Drug use: Never   Sexual activity: Yes    Partners: Male    Birth control/protection: Condom  Other Topics Concern   Not on file  Social History Narrative   HR dept   Social Determinants of Health   Financial Resource Strain: Not on file  Food Insecurity: Not on file  Transportation  Needs: Not on file  Physical Activity: Not on file  Stress: Not on file  Social Connections: Not on file  Intimate Partner Violence: Not on file    ROS  ROS: Gen: no fever, chills  Skin: no rash, itching ENT: no ear pain, ear drainage, nasal congestion, rhinorrhea, sinus pressure, sore throat Eyes: no blurry vision, double vision Resp:cough 1 mo ago-went to UC.  Dry cough-worse after eats something.  Has tried otc.  Gets yearly, and will last 1 month.  Just resolved. CV: no CP, palpitations, LE edema,   occ pain upper center of chest.  Ekg always normal.  Stress test in [place] 2021.   GI: no heartburn, n/v/d/c, abd pain GU: no dysuria, urgency, frequency, hematuria.  LMP 9/9.  Regular. MSK: no joint pain, myalgias, back pain Neuro: no dizziness, headache, weakness, vertigo Psych: no depression, anxiety, insomnia, SI   Objective:   Today's Vitals: BP 112/72   Pulse 73   Temp 98.1 F (36.7 C) (Temporal)   Ht _0  (1.6 m)   Wt 223 lb (101.2 kg)   LMP 08/25/2022 (Approximate)   SpO2 97%   BMI 39.50 kg/m   Physical Exam  Gen: WDWN NAD OF HEENT: NCAT, conjunctiva not injected, sclera nonicteric NECK:  supple,  no thyromegaly, no nodes, no carotid bruits CARDIAC: RRR, S1S2+, no murmur. DP 2+B LUNGS: CTAB. No wheezes ABDOMEN:  BS+, soft, NTND, No HSM, no masses EXT:  no edema MSK: no gross abnormalities.  NEURO: A&O x3.  CN II-XII intact.  PSYCH: normal mood. Good eye contact   Reviewed labs from [place].  07/30/2022 potassium 4.6 chloride 98 calcium sodium 140 9.2 iron 37.2 TIBC 480 transferrin 7.7 vitamin D 15.6 vitamin B12 242 BUN 14 creatinine 0.66 EGFR 104.  Creatinine ratio 22.6, uric acid 5.5 Hemoglobin A1c 5.8  total cholesterol 236 triglycerides 159 HDL 58 LDL 145 homocysteine 18.6 LP(a) 101 able to be 131 Total bilirubin 0.28 AST 16 ALT 16 alk phos 66 GGT 24 total protein 6.6 albumin 4.1 amylase 54 lipase 26 Platelet 387, hemoglobin 10.4 WBC 6.3 MCV 74.7 TSH 2.85 MRI  of pelvis done August 02, 2019 subserosal uterine fibroid and right lateral wall.  Diffuse adenomyosis, unilocular cyst in the right ovary, mucocele of appendix, umbilical hernia with peritoneal fat  U/s liver-fatty liver, L renal calculus  45 minutes spent with patient getting history, reviewing records that she brought with her, counseling on diet/exercise.  Reviewing plan/supplements.   Assessment & Plan:   Problem List Items Addressed This Visit       Other   Hyperlipidemia, mixed - Primary   Relevant Orders   Amb ref to Medical Nutrition Therapy-MNT   B12 deficiency   Vitamin D deficiency   Prediabetes   Relevant Orders   Amb ref to Medical Nutrition Therapy-MNT   Other Visit Diagnoses     Need for influenza vaccination       Relevant Orders   Flu Vaccine QUAD 6+ mos PF IM (Fluarix Quad PF) (Completed)   Class 2 severe obesity due to excess calories with serious comorbidity and body mass index (BMI) of 39.0 to 39.9 in adult [facility])       Relevant Orders   Amb ref to Medical Nutrition Therapy-MNT     1.  Mixed hyperlipidemia-patient has been working on diet/exercise.  It may be hereditary as well.  Triglycerides are little high, LDL, homocystine, LP(a)-advised that this is higher risk for heart disease (does not run in the family) ideally, would recommend statins.  However, patient would like to work on diet/exercise and supplements.  Advised possibly red yeast rice, folic acid for elevated homocystine.  Refer to nutritionist.  Keep a log of foods and drinks so she can share with them and find out how to adjust diet.  Follow-up in 3 months 2.  Prediabetes-patient has been working on diet/exercise.  Advised to check insurance if Mancel Parsons is covered.  Refer to nutritionist 3.  Vitamin D deficiency-advised to take 2000 IUs/day 4.  B12 deficiency-advised to take 1000 mcg/day.  She is vegetarian so cannot get this in her diet. 5.  Iron deficiency anemia-has had colonoscopy.  Advised to  take multivitamin with iron daily.  Recheck in 3 months 6.  Obesity-patient has been working on diet/exercise.  Still struggling to lose weight.  Advised to check insurance for [facility].  Consider her prior mean.  Refer to nutritionist.  No outpatient encounter medications on file as of 09/01/2022.   No facility-administered encounter medications on file as of 09/01/2022.    Follow-up: Return in about 3 months (around 12/01/2022) for prediabetes.  Wellington Hampshire, MD

## 2022-09-01 NOTE — Patient Instructions (Signed)
Welcome to [facility]! It was a pleasure meeting you today.  As discussed, Please schedule a 3 month follow up visit today.  Check insurance on [place]  Option-Phentermine-short term.   Supplements-folic acid '1mg'$  daily,  b12 1000 mcg daily.  Vitamin D 2000 IU daily.   Multivitamin with iron  PLEASE NOTE:  If you had any LAB tests please let us know if you have not heard back within a few days. You may see your results on MyChart before we have a chance to review them but we will give you a call once they are reviewed by [place]. If we ordered any REFERRALS today, please let us know if you have not heard from their office within the next week.  Let us know through MyChart if you are needing REFILLS, or have your pharmacy send [place] the request. You can also use MyChart to communicate with me or any office staff.  Please try these tips to maintain a healthy lifestyle:  Eat most of your calories during the day when you are active. Eliminate processed foods including packaged sweets (pies, cakes, cookies), reduce intake of potatoes, white bread, white pasta, and white rice. Look for whole grain options, oat flour or almond flour.  Each meal should contain half fruits/vegetables, one quarter protein, and one quarter carbs (no bigger than a computer mouse).  Cut down on sweet beverages. This includes juice, soda, and sweet tea. Also watch fruit intake, though this is a healthier sweet option, it still contains natural sugar! Limit to 3 servings daily.  Drink at least 1 glass of water with each meal and aim for at least 8 glasses per day  Exercise at least 150 minutes every week.

## 2022-09-02 NOTE — Telephone Encounter (Signed)
I think next day will help patient not having to go through prep again. Please verify with Dr Marlowe Aschoff office. Thanks

## 2022-09-09 ENCOUNTER — Encounter: Payer: BC Managed Care – PPO | Admitting: Gastroenterology

## 2022-09-10 ENCOUNTER — Encounter: Payer: Self-pay | Admitting: Gastroenterology

## 2022-09-11 NOTE — Telephone Encounter (Signed)
The biopsies are mucosal, surface biopsies and not always representative of any potential deeper pathology.  Based on the imaging, I would go through with follow-up with Dr. Barry Dienes for resection of the mucocele.  Once that is resected, the surgical specimen can also be evaluated by the Pathologist.  But yes, I would definitely follow-up with Dr. Barry Dienes to discuss surgical resection.

## 2022-09-24 ENCOUNTER — Ambulatory Visit: Payer: BC Managed Care – PPO | Admitting: Dietician

## 2022-10-01 NOTE — Pre-Procedure Instructions (Signed)
Surgical Instructions    Your procedure is scheduled on October 08, 2022.  Report to [facility] Main Entrance "A" at 5:30 A.M., then check in with the Admitting office.  Call this number if you have problems the morning of surgery:  [phone]   If you have any questions prior to your surgery date call [phone]: Open Monday-Friday 8am-4pm    Remember:  Do not eat after midnight the night before your surgery  You may drink clear liquids until 4:30 AM the morning of your surgery.   Clear liquids allowed are: Water, Non-Citrus Juices (without pulp), Carbonated Beverages, Clear Tea, Black Coffee Only (NO MILK, CREAM OR POWDERED CREAMER of any kind), and Gatorade.     Take these medicines the morning of surgery with A SIP OF WATER:  acetaminophen (TYLENOL) - may take as needed   As of today, STOP taking any Aspirin (unless otherwise instructed by your surgeon) Aleve, Naproxen, Ibuprofen, Motrin, Advil, Goody's, BC's, all herbal medications, fish oil, and all vitamins.                     Do NOT Smoke (Tobacco/Vaping) for 24 hours prior to your procedure.  If you use a CPAP at night, you may bring your mask/headgear for your overnight stay.   Contacts, glasses, piercing's, hearing aid's, dentures or partials may not be worn into surgery, please bring cases for these belongings.    For patients admitted to the hospital, discharge time will be determined by your treatment team.   Patients discharged the day of surgery will not be allowed to drive home, and someone needs to stay with them for 24 hours.  SURGICAL WAITING ROOM VISITATION Patients having surgery or a procedure may have no more than 2 support people in the waiting area - these visitors may rotate.   Children under the age of 66 must have an adult with them who is not the patient. If the patient needs to stay at the hospital during part of their recovery, the visitor guidelines for inpatient rooms apply. Pre-op nurse  will coordinate an appropriate time for 1 support person to accompany patient in pre-op.  This support person may not rotate.   Please refer to the [facility] website for the visitor guidelines for Inpatients (after your surgery is over and you are in a regular room).    Special instructions:   [place]- Preparing For Surgery  Before surgery, you can play an important role. Because skin is not sterile, your skin needs to be as free of germs as possible. You can reduce the number of germs on your skin by washing with CHG (chlorahexidine gluconate) Soap before surgery.  CHG is an antiseptic cleaner which kills germs and bonds with the skin to continue killing germs even after washing.    Oral Hygiene is also important to reduce your risk of infection.  Remember - BRUSH YOUR TEETH THE MORNING OF SURGERY WITH YOUR REGULAR TOOTHPASTE  Please do not use if you have an allergy to CHG or antibacterial soaps. If your skin becomes reddened/irritated stop using the CHG.  Do not shave (including legs and underarms) for at least 48 hours prior to first CHG shower. It is OK to shave your face.  Please follow these instructions carefully.   Shower the NIGHT BEFORE SURGERY and the MORNING OF SURGERY  If you chose to wash your hair, wash your hair first as usual with your normal shampoo.  After you shampoo, rinse  your hair and body thoroughly to remove the shampoo.  Use CHG Soap as you would any other liquid soap. You can apply CHG directly to the skin and wash gently with a scrungie or a clean washcloth.   Apply the CHG Soap to your body ONLY FROM THE NECK DOWN.  Do not use on open wounds or open sores. Avoid contact with your eyes, ears, mouth and genitals (private parts). Wash Face and genitals (private parts)  with your normal soap.   Wash thoroughly, paying special attention to the area where your surgery will be performed.  Thoroughly rinse your body with warm water from the neck down.  DO NOT  shower/wash with your normal soap after using and rinsing off the CHG Soap.  Pat yourself dry with a CLEAN TOWEL.  Wear CLEAN PAJAMAS to bed the night before surgery  Place CLEAN SHEETS on your bed the night before your surgery  DO NOT SLEEP WITH PETS.   Day of Surgery: Take a shower with CHG soap. Do not wear jewelry or makeup Do not wear lotions, powders, perfumes/colognes, or deodorant. Do not shave 48 hours prior to surgery.  Men may shave face and neck. Do not bring valuables to the hospital.  [facility] is not responsible for any belongings or valuables. Do not wear nail polish, gel polish, artificial nails, or any other type of covering on natural nails (fingers and toes) If you have artificial nails or gel coating that need to be removed by a nail salon, please have this removed prior to surgery. Artificial nails or gel coating may interfere with anesthesia's ability to adequately monitor your vital signs.  Wear Clean/Comfortable clothing the morning of surgery Remember to brush your teeth WITH YOUR REGULAR TOOTHPASTE.   Please read over the following fact sheets that you were given.    If you received a COVID test during your pre-op visit  it is requested that you wear a mask when out in public, stay away from anyone that may not be feeling well and notify your surgeon if you develop symptoms. If you have been in contact with anyone that has tested positive in the last 10 days please notify you surgeon.

## 2022-10-02 ENCOUNTER — Encounter (HOSPITAL_COMMUNITY)
Admission: RE | Admit: 2022-10-02 | Discharge: 2022-10-02 | Disposition: A | Discharge status: Home / Self Care | Payer: BC Managed Care – PPO | Source: Ambulatory Visit | Attending: General Surgery | Admitting: General Surgery

## 2022-10-02 ENCOUNTER — Encounter (HOSPITAL_COMMUNITY): Payer: Self-pay

## 2022-10-02 ENCOUNTER — Other Ambulatory Visit: Payer: Self-pay

## 2022-10-02 VITALS — BP 109/59 | HR 78 | Temp 98.4°F | Resp 17 | Ht 62.0 in | Wt 220.3 lb

## 2022-10-02 DIAGNOSIS — Z01818 Encounter for other preprocedural examination: Secondary | ICD-10-CM | POA: Insufficient documentation

## 2022-10-02 DIAGNOSIS — K769 Liver disease, unspecified: Secondary | ICD-10-CM | POA: Diagnosis not present

## 2022-10-02 HISTORY — DX: Fatty (change of) liver, not elsewhere classified: K76.0

## 2022-10-02 LAB — COMPREHENSIVE METABOLIC PANEL
ALT: 20 U/L (ref 0–44)
AST: 20 U/L (ref 15–41)
Albumin: 3.7 g/dL (ref 3.5–5.0)
Alkaline Phosphatase: 58 U/L (ref 38–126)
Anion gap: 9 (ref 5–15)
BUN: 6 mg/dL (ref 6–20)
CO2: 25 mmol/L (ref 22–32)
Calcium: 9.5 mg/dL (ref 8.9–10.3)
Chloride: 106 mmol/L (ref 98–111)
Creatinine, Ser: 0.65 mg/dL (ref 0.44–1.00)
GFR, Estimated: >60 mL/min (ref >60)
Glucose, Bld: 103 mg/dL — ABNORMAL HIGH (ref 70–99)
Potassium: 4 mmol/L (ref 3.5–5.1)
Sodium: 140 mmol/L (ref 135–145)
Total Bilirubin: 0.4 mg/dL (ref 0.3–1.2)
Total Protein: 6.6 g/dL (ref 6.5–8.1)

## 2022-10-02 LAB — CBC
HCT: 34.8 % — ABNORMAL LOW (ref 36.0–46.0)
Hemoglobin: 11 g/dL — ABNORMAL LOW (ref 12.0–15.0)
MCH: 23.9 pg — ABNORMAL LOW (ref 26.0–34.0)
MCHC: 31.6 g/dL (ref 30.0–36.0)
MCV: 75.5 fL — ABNORMAL LOW (ref 80.0–100.0)
Platelets: 335 10*3/uL (ref 150–400)
RBC: 4.61 MIL/uL (ref 3.87–5.11)
RDW: 16.5 % — ABNORMAL HIGH (ref 11.5–15.5)
WBC: 6.3 10*3/uL (ref 4.0–10.5)
nRBC: 0 % (ref 0.0–0.2)

## 2022-10-02 NOTE — Progress Notes (Signed)
PCP - Dr. Tawnya Crook Cardiologist - Per pt, saw a cardiologist 5+ years ago for CP, but work-up was negative  PPM/ICD - Denies Device Orders - n/a Rep Notified - n/a  Chest x-ray - n/a EKG - 07/31/2022 Stress Test - Denies ECHO - Denies Cardiac Cath - Denies  Sleep Study - Denies CPAP - n/a  No DM  Last dose of GLP1 agonist- n/a GLP1 instructions: n/a  Blood Thinner Instructions: n/a Aspirin Instructions: n/a  ERAS Protcol - Yes. Clear liquids until 0430 morning of surgery PRE-SURGERY Ensure or G2- None Ordered.  COVID TEST- n/a   Anesthesia review: No.   Patient denies shortness of breath, fever, cough and chest pain at PAT appointment   All instructions explained to the patient, with a verbal understanding of the material. Patient agrees to go over the instructions while at home for a better understanding. Patient also instructed to self quarantine after being tested for COVID-19. The opportunity to ask questions was provided.

## 2022-10-08 ENCOUNTER — Other Ambulatory Visit: Payer: Self-pay

## 2022-10-08 ENCOUNTER — Ambulatory Visit (HOSPITAL_COMMUNITY)
Admission: RE | Admit: 2022-10-08 | Discharge: 2022-10-08 | Disposition: A | Discharge status: Home / Self Care | Payer: BC Managed Care – PPO | Attending: General Surgery | Admitting: General Surgery

## 2022-10-08 ENCOUNTER — Encounter (HOSPITAL_COMMUNITY)
Admission: RE | Disposition: A | Discharge status: Home / Self Care | Payer: Self-pay | Source: Home / Self Care | Attending: General Surgery

## 2022-10-08 ENCOUNTER — Encounter (HOSPITAL_COMMUNITY): Payer: Self-pay | Admitting: General Surgery

## 2022-10-08 ENCOUNTER — Ambulatory Visit (HOSPITAL_COMMUNITY): Payer: BC Managed Care – PPO | Admitting: Anesthesiology

## 2022-10-08 DIAGNOSIS — K388 Other specified diseases of appendix: Secondary | ICD-10-CM

## 2022-10-08 DIAGNOSIS — Z87442 Personal history of urinary calculi: Secondary | ICD-10-CM | POA: Insufficient documentation

## 2022-10-08 DIAGNOSIS — K429 Umbilical hernia without obstruction or gangrene: Secondary | ICD-10-CM | POA: Diagnosis not present

## 2022-10-08 DIAGNOSIS — D490 Neoplasm of unspecified behavior of digestive system: Secondary | ICD-10-CM | POA: Diagnosis not present

## 2022-10-08 DIAGNOSIS — D373 Neoplasm of uncertain behavior of appendix: Secondary | ICD-10-CM | POA: Diagnosis not present

## 2022-10-08 DIAGNOSIS — Z6841 Body Mass Index (BMI) 40.0 and over, adult: Secondary | ICD-10-CM | POA: Insufficient documentation

## 2022-10-08 DIAGNOSIS — C181 Malignant neoplasm of appendix: Secondary | ICD-10-CM | POA: Diagnosis not present

## 2022-10-08 HISTORY — PX: UMBILICAL HERNIA REPAIR: SHX196

## 2022-10-08 HISTORY — PX: LAPAROSCOPIC APPENDECTOMY: SHX408

## 2022-10-08 SURGERY — APPENDECTOMY, LAPAROSCOPIC
Anesthesia: General | Site: Abdomen

## 2022-10-08 MED ORDER — PROPOFOL 10 MG/ML IV BOLUS
INTRAVENOUS | Status: AC
  Filled 2022-10-08: qty 20

## 2022-10-08 MED ORDER — SUGAMMADEX SODIUM 200 MG/2ML IV SOLN
INTRAVENOUS | Status: DC | PRN
  Administered 2022-10-08: 200 mg via INTRAVENOUS

## 2022-10-08 MED ORDER — BUPIVACAINE-EPINEPHRINE (PF) 0.25% -1:200000 IJ SOLN
INTRAMUSCULAR | Status: AC
  Filled 2022-10-08: qty 30

## 2022-10-08 MED ORDER — DEXAMETHASONE SODIUM PHOSPHATE 10 MG/ML IJ SOLN
INTRAMUSCULAR | Status: AC
  Filled 2022-10-08: qty 1

## 2022-10-08 MED ORDER — MIDAZOLAM HCL 5 MG/5ML IJ SOLN
INTRAMUSCULAR | Status: DC | PRN
  Administered 2022-10-08: 2 mg via INTRAVENOUS

## 2022-10-08 MED ORDER — LACTATED RINGERS IV SOLN: INTRAVENOUS | Status: DC

## 2022-10-08 MED ORDER — PHENYLEPHRINE 80 MCG/ML (10ML) SYRINGE FOR IV PUSH (FOR BLOOD PRESSURE SUPPORT)
PREFILLED_SYRINGE | INTRAVENOUS | Status: DC | PRN
  Administered 2022-10-08: 160 ug via INTRAVENOUS
  Administered 2022-10-08 (×2): 80 ug via INTRAVENOUS
  Administered 2022-10-08: 160 ug via INTRAVENOUS

## 2022-10-08 MED ORDER — OXYCODONE HCL 5 MG PO TABS
ORAL_TABLET | ORAL | Status: AC
  Filled 2022-10-08: qty 1

## 2022-10-08 MED ORDER — FENTANYL CITRATE (PF) 100 MCG/2ML IJ SOLN
25.0000 ug | INTRAMUSCULAR | Status: DC | PRN
  Administered 2022-10-08 (×2): 50 ug via INTRAVENOUS

## 2022-10-08 MED ORDER — ONDANSETRON HCL 4 MG/2ML IJ SOLN
INTRAMUSCULAR | Status: AC
  Filled 2022-10-08: qty 2

## 2022-10-08 MED ORDER — CEFAZOLIN SODIUM-DEXTROSE 2-4 GM/100ML-% IV SOLN
2.0000 g | INTRAVENOUS | Status: AC
  Administered 2022-10-08: 2 g via INTRAVENOUS
  Filled 2022-10-08: qty 100

## 2022-10-08 MED ORDER — CHLORHEXIDINE GLUCONATE CLOTH 2 % EX PADS: 6.0000 | MEDICATED_PAD | Freq: Once | CUTANEOUS | Status: DC

## 2022-10-08 MED ORDER — ACETAMINOPHEN 500 MG PO TABS
1000.0000 mg | ORAL_TABLET | ORAL | Status: AC
  Administered 2022-10-08: 1000 mg via ORAL
  Filled 2022-10-08: qty 2

## 2022-10-08 MED ORDER — KETOROLAC TROMETHAMINE 30 MG/ML IJ SOLN
INTRAMUSCULAR | Status: AC
  Filled 2022-10-08: qty 1

## 2022-10-08 MED ORDER — ONDANSETRON HCL 4 MG/2ML IJ SOLN: 4.0000 mg | Freq: Once | INTRAMUSCULAR | Status: DC | PRN

## 2022-10-08 MED ORDER — OXYCODONE HCL 5 MG PO TABS: 5.0000 mg | ORAL_TABLET | Freq: Four times a day (QID) | ORAL | 0 refills | Status: DC | PRN

## 2022-10-08 MED ORDER — DEXAMETHASONE SODIUM PHOSPHATE 10 MG/ML IJ SOLN
INTRAMUSCULAR | Status: DC | PRN
  Administered 2022-10-08: 4 mg via INTRAVENOUS

## 2022-10-08 MED ORDER — MIDAZOLAM HCL 2 MG/2ML IJ SOLN
INTRAMUSCULAR | Status: AC
  Filled 2022-10-08: qty 2

## 2022-10-08 MED ORDER — SODIUM CHLORIDE 0.9 % IR SOLN
Status: DC | PRN
  Administered 2022-10-08: 1

## 2022-10-08 MED ORDER — ROCURONIUM BROMIDE 10 MG/ML (PF) SYRINGE
PREFILLED_SYRINGE | INTRAVENOUS | Status: DC | PRN
  Administered 2022-10-08: 60 mg via INTRAVENOUS

## 2022-10-08 MED ORDER — LIDOCAINE HCL 1 % IJ SOLN
INTRAMUSCULAR | Status: DC | PRN
  Administered 2022-10-08: 27 mL via INTRAMUSCULAR

## 2022-10-08 MED ORDER — LIDOCAINE HCL 1 % IJ SOLN
INTRAMUSCULAR | Status: AC
  Filled 2022-10-08: qty 20

## 2022-10-08 MED ORDER — LIDOCAINE 2% (20 MG/ML) 5 ML SYRINGE
INTRAMUSCULAR | Status: DC | PRN
  Administered 2022-10-08: 100 mg via INTRAVENOUS

## 2022-10-08 MED ORDER — KETOROLAC TROMETHAMINE 30 MG/ML IJ SOLN: 30.0000 mg | Freq: Once | INTRAMUSCULAR | Status: AC | PRN

## 2022-10-08 MED ORDER — OXYCODONE HCL 5 MG PO TABS
5.0000 mg | ORAL_TABLET | Freq: Once | ORAL | Status: AC | PRN
  Administered 2022-10-08: 5 mg via ORAL

## 2022-10-08 MED ORDER — LIDOCAINE 2% (20 MG/ML) 5 ML SYRINGE
INTRAMUSCULAR | Status: AC
  Filled 2022-10-08: qty 5

## 2022-10-08 MED ORDER — PHENYLEPHRINE HCL-NACL 20-0.9 MG/250ML-% IV SOLN
INTRAVENOUS | Status: DC | PRN
  Administered 2022-10-08: 25 ug/min via INTRAVENOUS

## 2022-10-08 MED ORDER — PROPOFOL 10 MG/ML IV BOLUS
INTRAVENOUS | Status: DC | PRN
  Administered 2022-10-08: 30 mg via INTRAVENOUS
  Administered 2022-10-08: 150 mg via INTRAVENOUS
  Administered 2022-10-08: 20 mg via INTRAVENOUS

## 2022-10-08 MED ORDER — FENTANYL CITRATE (PF) 250 MCG/5ML IJ SOLN
INTRAMUSCULAR | Status: AC
  Filled 2022-10-08: qty 5

## 2022-10-08 MED ORDER — EPHEDRINE SULFATE-NACL 50-0.9 MG/10ML-% IV SOSY
PREFILLED_SYRINGE | INTRAVENOUS | Status: DC | PRN
  Administered 2022-10-08 (×2): 5 mg via INTRAVENOUS

## 2022-10-08 MED ORDER — ONDANSETRON HCL 4 MG/2ML IJ SOLN
INTRAMUSCULAR | Status: DC | PRN
  Administered 2022-10-08: 4 mg via INTRAVENOUS

## 2022-10-08 MED ORDER — OXYCODONE HCL 5 MG/5ML PO SOLN: 5.0000 mg | Freq: Once | ORAL | Status: AC | PRN

## 2022-10-08 MED ORDER — ROCURONIUM BROMIDE 10 MG/ML (PF) SYRINGE
PREFILLED_SYRINGE | INTRAVENOUS | Status: AC
  Filled 2022-10-08: qty 10

## 2022-10-08 MED ORDER — FENTANYL CITRATE (PF) 250 MCG/5ML IJ SOLN
INTRAMUSCULAR | Status: DC | PRN
  Administered 2022-10-08 (×2): 50 ug via INTRAVENOUS
  Administered 2022-10-08: 100 ug via INTRAVENOUS
  Administered 2022-10-08: 50 ug via INTRAVENOUS

## 2022-10-08 MED ORDER — GLYCOPYRROLATE PF 0.2 MG/ML IJ SOSY
PREFILLED_SYRINGE | INTRAMUSCULAR | Status: DC | PRN
  Administered 2022-10-08: .2 mg via INTRAVENOUS

## 2022-10-08 MED ORDER — ORAL CARE MOUTH RINSE: 15.0000 mL | Freq: Once | OROMUCOSAL | Status: AC

## 2022-10-08 MED ORDER — FENTANYL CITRATE (PF) 100 MCG/2ML IJ SOLN
INTRAMUSCULAR | Status: AC
  Filled 2022-10-08: qty 2

## 2022-10-08 MED ORDER — CHLORHEXIDINE GLUCONATE 0.12 % MT SOLN
15.0000 mL | Freq: Once | OROMUCOSAL | Status: AC
  Administered 2022-10-08: 15 mL via OROMUCOSAL
  Filled 2022-10-08: qty 15

## 2022-10-08 MED ORDER — SUCCINYLCHOLINE CHLORIDE 200 MG/10ML IV SOSY
PREFILLED_SYRINGE | INTRAVENOUS | Status: AC
  Filled 2022-10-08: qty 10

## 2022-10-08 SURGICAL SUPPLY — 41 items
BLADE CLIPPER SURG (BLADE) IMPLANT
CANISTER SUCT 3000ML PPV (MISCELLANEOUS) ×2 IMPLANT
CHLORAPREP W/TINT 26 (MISCELLANEOUS) ×2 IMPLANT
COVER SURGICAL LIGHT HANDLE (MISCELLANEOUS) ×2 IMPLANT
CUTTER FLEX LINEAR 45M (STAPLE) ×2 IMPLANT
DERMABOND ADVANCED .7 DNX12 (GAUZE/BANDAGES/DRESSINGS) ×2 IMPLANT
DEVICE TROCAR PUNCTURE CLOSURE (ENDOMECHANICALS) IMPLANT
ELECT REM PT RETURN 9FT ADLT (ELECTROSURGICAL) ×2
ELECTRODE REM PT RTRN 9FT ADLT (ELECTROSURGICAL) ×2 IMPLANT
GLOVE BIO SURGEON STRL SZ 6 (GLOVE) ×2 IMPLANT
GLOVE INDICATOR 6.5 STRL GRN (GLOVE) ×2 IMPLANT
GOWN STRL REUS W/ TWL LRG LVL3 (GOWN DISPOSABLE) ×4 IMPLANT
GOWN STRL REUS W/TWL 2XL LVL3 (GOWN DISPOSABLE) ×2 IMPLANT
GOWN STRL REUS W/TWL LRG LVL3 (GOWN DISPOSABLE) ×8
KIT BASIN OR (CUSTOM PROCEDURE TRAY) ×2 IMPLANT
KIT TURNOVER KIT B (KITS) ×2 IMPLANT
L-HOOK LAP DISP 36CM (ELECTROSURGICAL) ×2
LHOOK LAP DISP 36CM (ELECTROSURGICAL) ×2 IMPLANT
NS IRRIG 1000ML POUR BTL (IV SOLUTION) ×2 IMPLANT
PAD ARMBOARD 7.5X6 YLW CONV (MISCELLANEOUS) ×4 IMPLANT
PENCIL BUTTON HOLSTER BLD 10FT (ELECTRODE) ×2 IMPLANT
POUCH RETRIEVAL ECOSAC 10 (ENDOMECHANICALS) ×2 IMPLANT
POUCH RETRIEVAL ECOSAC 10MM (ENDOMECHANICALS) ×2
RELOAD STAPLE 45 3.5 BLU ETS (ENDOMECHANICALS) ×2 IMPLANT
RELOAD STAPLE TA45 3.5 REG BLU (ENDOMECHANICALS) ×6 IMPLANT
SET IRRIG TUBING LAPAROSCOPIC (IRRIGATION / IRRIGATOR) ×2 IMPLANT
SET TUBE SMOKE EVAC HIGH FLOW (TUBING) ×2 IMPLANT
SHEARS HARMONIC HDI 36CM (ELECTROSURGICAL) ×2 IMPLANT
SLEEVE ENDOPATH XCEL 5M (ENDOMECHANICALS) ×2 IMPLANT
SPECIMEN JAR SMALL (MISCELLANEOUS) ×2 IMPLANT
SUT MNCRL AB 4-0 PS2 18 (SUTURE) ×2 IMPLANT
SUT VIC AB 0 UR5 27 (SUTURE) IMPLANT
SUT VICRYL 0 UR6 27IN ABS (SUTURE) IMPLANT
TOWEL GREEN STERILE (TOWEL DISPOSABLE) ×2 IMPLANT
TRAY FOLEY W/BAG SLVR 16FR (SET/KITS/TRAYS/PACK) ×2
TRAY FOLEY W/BAG SLVR 16FR ST (SET/KITS/TRAYS/PACK) ×2 IMPLANT
TRAY LAPAROSCOPIC MC (CUSTOM PROCEDURE TRAY) ×2 IMPLANT
TROCAR XCEL BLUNT TIP 100MML (ENDOMECHANICALS) ×2 IMPLANT
TROCAR Z THREAD OPTICAL 5X150 (TROCAR) IMPLANT
TROCAR Z-THREAD OPTICAL 5X100M (TROCAR) ×2 IMPLANT
WARMER LAPAROSCOPE (MISCELLANEOUS) ×2 IMPLANT

## 2022-10-08 NOTE — H&P (Signed)
Chief Complaint Patient presents with New Consultation  Referring MD: Mody  HISTORY:  Patient is referred by Dr. Genia Harold for enlarged appendix. This was diagnosed incidentally. She has history of right renal stone and reported recurrent pain. She had imaging which showed an unusual appearing tubular struction and so was referred for an MRI of the pelvis. There is also question of right ovarian cyst. The tubular structure was noted to be elongated and fluid-filled in the right iliac fossa with the tip lying anterior to the right adnexa and have the uterus. It seems to be arising from the base of the cecum. This was 11.7 cm in length and 2.4 cm in diameter. Contents appeared to be hyperintense on T2 and hypointense on T1. No septations were noted. She had a very small fat-containing umbilical hernia.  She does have some right flank and some back pain consistent with her previous kidney stones. She denies nausea or vomiting. She does have a bit of a cough right now and is wearing a mask.  She has no personal or family history of malignancy. She has not ever had a colonoscopy. She is up-to-date with her mammograms and these have been normal.  Imaging: Images are from [facility] and we do not have the pictures. These are described above. Date of the MRI was August 01, 2022.  Past Medical History: Diagnosis Date Anemia  History reviewed. No pertinent surgical history.  No current outpatient medications on file.  No current facility-administered medications for this visit.  No Known Allergies  History reviewed. No pertinent family history.  Social History  Socioeconomic History Marital status: Married Tobacco Use Smoking status: Never Substance and Sexual Activity Alcohol use: Yes Drug use: Never  REVIEW OF SYSTEMS - PERTINENT POSITIVES ONLY: A complete review of systems negative other than HPI and PMH  EXAM: Vitals: BP: 128/86 Pulse: (!) 121 Temp: 37.1 C (98.7  F)  Wt Readings from Last 3 Encounters: 08/12/22 (!) 101.5 kg (223 lb 12.8 oz)  Gen: No acute distress. Well nourished and well groomed. Neurological: Alert and oriented to person, place, and time. Coordination normal. Head: Normocephalic and atraumatic. Eyes: Conjunctivae are normal. Pupils are equal, round, and reactive to light. No scleral icterus. Neck: Normal range of motion. Neck supple. No tracheal deviation or thyromegaly present. Cardiovascular: Normal rate, regular rhythm, normal heart sounds and intact distal pulses. Exam reveals no gallop and no friction rub. No murmur heard. Respiratory: Effort normal. No respiratory distress. No chest wall tenderness. Breath sounds normal. No wheezes, rales or rhonchi. GI: Soft. Bowel sounds are normal. The abdomen is soft and nontender. There is no rebound and no guarding. Musculoskeletal: Normal range of motion. Extremities are nontender. Lymphadenopathy: No cervical, preauricular, postauricular or axillary adenopathy is present Skin: Skin is warm and dry. No rash noted. No diaphoresis. No erythema. No pallor. No clubbing, cyanosis, or edema. Psychiatric: Normal mood and affect. Behavior is normal. Judgment and thought content normal.  RADIOLOGY RESULTS: See above.  ASSESSMENT AND PLAN: Mucocele of appendix (primary encounter diagnosis) Plan: Ambulatory referral to Gastroenterology  Cough, unspecified type Plan: Ambulatory Referral to Primary Care  Patient most likely has a mucocele of the appendix. This appendix is quite enlarged. I am referring her for colonoscopy in order to ensure there is no mass in the cecum obstructing the appendix. I have messaged several of the GI doctors trying to expedite this referral.  I discussed with the patient that if there are no findings on the colonoscopy  that I would recommend laparoscopic appendectomy. If there is a mass at the base of the appendix or in the cecum, I would recommend a right  hemicolectomy. Either of these cases I would do laparoscopically  If she does need any surgery other than an appendectomy I will bring her back to the office to discuss.  I reviewed laparoscopic appendectomy in great detail with diagrams of anatomy. I discussed risks of surgery including bleeding, infection, damage to adjacent structures, leak of one of the staple lines, abscess, wound infection, possible need for additional procedures or surgeries. I also reviewed postoperative recovery expectations and time off of work.  Unless she required a partial colectomy, I would not plan to do any bowel prep for the appendectomy. I will need to time her surgery at least a week out from her colonoscopy in order to allow pathology time to come back and any adjustment in the surgery to be arranged.

## 2022-10-08 NOTE — Interval H&P Note (Signed)
History and Physical Interval Note:  10/08/2022 7:53 AM  Breanna Byrd  has presented today for surgery, with the diagnosis of [place].  The various methods of treatment have been discussed with the patient and family. After consideration of risks, benefits and other options for treatment, the patient has consented to  Procedure(s): APPENDECTOMY LAPAROSCOPIC (N/A) as a surgical intervention.  The patient's history has been reviewed, patient examined, no change in status, stable for surgery.  I have reviewed the patient's chart and labs.  Questions were answered to the patient's satisfaction.     Stark Klein

## 2022-10-08 NOTE — Anesthesia Postprocedure Evaluation (Signed)
Anesthesia Post Note  Patient: Breanna Byrd  Procedure(s) Performed: APPENDECTOMY LAPAROSCOPIC (Abdomen) HERNIA REPAIR UMBILICAL ADULT (Abdomen)     Patient location during evaluation: PACU Anesthesia Type: General Level of consciousness: awake and alert Pain management: pain level controlled Vital Signs Assessment: post-procedure vital signs reviewed and stable Respiratory status: spontaneous breathing, nonlabored ventilation, respiratory function stable and patient connected to nasal cannula oxygen Cardiovascular status: blood pressure returned to baseline and stable Postop Assessment: no apparent nausea or vomiting Anesthetic complications: no   No notable events documented.  Last Vitals:  Vitals:   10/08/22 1015 10/08/22 1030  BP: 113/67 110/64  Pulse: 80 76  Resp: 16 17  Temp:    SpO2: 97% 98%    Last Pain:  Vitals:   10/08/22 1030  TempSrc:   PainSc: 10-Worst pain ever                 Kobi Mario S

## 2022-10-08 NOTE — Anesthesia Preprocedure Evaluation (Signed)
Anesthesia Evaluation  Patient identified by MRN, date of birth, ID band Patient awake    Reviewed: Allergy & Precautions, NPO status , Patient's Chart, lab work & pertinent test results  Airway Mallampati: II  TM Distance: >3 FB Neck ROM: Full    Dental no notable dental hx.    Pulmonary neg pulmonary ROS,    Pulmonary exam normal breath sounds clear to auscultation       Cardiovascular negative cardio ROS Normal cardiovascular exam Rhythm:Regular Rate:Normal     Neuro/Psych negative neurological ROS  negative psych ROS   GI/Hepatic negative GI ROS, Neg liver ROS,   Endo/Other  Morbid obesity  Renal/GU negative Renal ROS  negative genitourinary   Musculoskeletal negative musculoskeletal ROS (+)   Abdominal   Peds negative pediatric ROS (+)  Hematology negative hematology ROS (+)   Anesthesia Other Findings   Reproductive/Obstetrics negative OB ROS                             Anesthesia Physical Anesthesia Plan  ASA: 2  Anesthesia Plan: General   Post-op Pain Management: Minimal or no pain anticipated   Induction: Intravenous  PONV Risk Score and Plan: 3 and Ondansetron, Dexamethasone and Treatment may vary due to age or medical condition  Airway Management Planned: Oral ETT  Additional Equipment:   Intra-op Plan:   Post-operative Plan: Extubation in OR  Informed Consent: I have reviewed the patients History and Physical, chart, labs and discussed the procedure including the risks, benefits and alternatives for the proposed anesthesia with the patient or authorized representative who has indicated his/her understanding and acceptance.     Dental advisory given  Plan Discussed with: CRNA and Surgeon  Anesthesia Plan Comments:         Anesthesia Quick Evaluation

## 2022-10-08 NOTE — Anesthesia Procedure Notes (Signed)
Procedure Name: Intubation Date/Time: 10/08/2022 8:24 AM  Performed by: Lowella Dell, CRNAPre-anesthesia Checklist: Patient identified, Emergency Drugs available, Suction available and Patient being monitored Patient Re-evaluated:Patient Re-evaluated prior to induction Oxygen Delivery Method: Circle System Utilized Preoxygenation: Pre-oxygenation with 100% oxygen Induction Type: IV induction Ventilation: Mask ventilation without difficulty Laryngoscope Size: Mac and 3 Grade View: Grade II Tube type: Oral Tube size: 6.5 mm Number of attempts: 1 Airway Equipment and Method: Stylet Placement Confirmation: ETT inserted through vocal cords under direct vision, positive ETCO2 and breath sounds checked- equal and bilateral Secured at: 21 cm Tube secured with: Tape Dental Injury: Teeth and Oropharynx as per pre-operative assessment

## 2022-10-08 NOTE — Transfer of Care (Signed)
Immediate Anesthesia Transfer of Care Note  Patient: Breanna Byrd  Procedure(s) Performed: APPENDECTOMY LAPAROSCOPIC (Abdomen) HERNIA REPAIR UMBILICAL ADULT (Abdomen)  Patient Location: PACU  Anesthesia Type:General  Level of Consciousness: awake and patient cooperative  Airway & Oxygen Therapy: Patient Spontanous Breathing and Patient connected to nasal cannula oxygen  Post-op Assessment: Report given to RN and Post -op Vital signs reviewed and stable  Post vital signs: Reviewed and stable  Last Vitals:  Vitals Value Taken Time  BP 117/68 10/08/22 1009  Temp    Pulse 92 10/08/22 1010  Resp 16 10/08/22 1010  SpO2 99 % 10/08/22 1010  Vitals shown include unvalidated device data.  Last Pain:  Vitals:   10/08/22 0626  TempSrc: Oral  PainSc: 0-No pain         Complications: No notable events documented.

## 2022-10-08 NOTE — Discharge Instructions (Addendum)
[place] Office Phone Number [phone]   POST OP INSTRUCTIONS  Always review your discharge instruction sheet given to you by the facility where your surgery was performed.  IF YOU HAVE DISABILITY OR FAMILY LEAVE FORMS, YOU MUST BRING THEM TO THE OFFICE FOR PROCESSING.  DO NOT GIVE THEM TO YOUR DOCTOR.  Take 2 tylenol (acetominophen) three times a day for 3 days.  If you still have pain, add ibuprofen with food in between if able to take this (if you have kidney issues or stomach issues, do not take ibuprofen).  If both of those are not enough, add the narcotic pain pill.  If you find you are needing a lot of this overnight after surgery, call the next morning for a refill.   Take your usually prescribed medications unless otherwise directed If you need a refill on your pain medication, please contact your pharmacy.  They will contact our office to request authorization.  Prescriptions will not be filled after 5pm or on week-ends. You should eat very light the first 24 hours after surgery, such as soup, crackers, pudding, etc.  Resume your normal diet the day after surgery It is common to experience some constipation if taking pain medication after surgery.  Increasing fluid intake and taking a stool softener will usually help or prevent this problem from occurring.  A mild laxative (Milk of Magnesia or Miralax) should be taken according to package directions if there are no bowel movements after 48 hours. You may shower in 48 hours.  The surgical glue will flake off in 2-3 weeks.   ACTIVITIES:  No strenuous activity or heavy lifting for 1 week.   You may drive when you no longer are taking prescription pain medication, you can comfortably wear a seatbelt, and you can safely maneuver your car and apply brakes. RETURN TO WORK:  __________as tolerated as long as no lifting.  _______________ Dennis Bast should see your doctor in the office for a follow-up appointment approximately  three-four weeks after your surgery.    WHEN TO CALL YOUR DOCTOR: Fever over 101.0 Nausea and/or vomiting. Extreme swelling or bruising. Continued bleeding from incision. Increased pain, redness, or drainage from the incision.  The clinic staff is available to answer your questions during regular business hours.  Please don't hesitate to call and ask to speak to one of the nurses for clinical concerns.  If you have a medical emergency, go to the nearest emergency room or call 911.  A surgeon from [facility] is always on call at the hospital.  For further questions, please visit [email or web address]

## 2022-10-08 NOTE — Op Note (Signed)
Appendectomy, Lap, Procedure Note  Indications: The patient presented with a history of kidney stones.  Ct showed incidental finding of a pelvic tubular struction  She got a pelvic US and an MR pelvis that were both consistent with mucocele of the appendix.  Colonoscopy was performed and was negative for mass at the appendiceal orifice.    Pre-operative Diagnosis: mucocele of the appendix  Post-operative Diagnosis: Same  Surgeon: Stark Klein   Anesthesia: General endotracheal anesthesia and local  ASA Class: 2  Procedure Details  The patient was seen again in the Holding Room. The risks, benefits, complications, treatment options, and expected outcomes were discussed with the patient and/or family. The possibilities of perforation of viscus, bleeding, recurrent infection, the need for additional procedures, failure to diagnose a condition, and creating a complication requiring transfusion or operation were discussed. There was concurrence with the proposed plan and informed consent was obtained. The site of surgery was properly noted. The patient was taken to Operating Room, identified as Breanna Byrd and the procedure verified as Appendectomy. A Time Out was held and the above information confirmed.  The patient was placed in the supine position and general anesthesia was induced, along with placement of orogastric tube, Venodyne boots, and a Foley catheter. The abdomen was prepped and draped in a sterile fashion. Local anesthetic was infiltrated in the infraumbilical region.  A 1.5 cm vertical incision was made just below the umbilicus.  The Kelly clamp was used to spread the subcutaneous tissues.  The umbilical hernia was found and was a bit more superior than the incision, but it was isolated in order to target the appropriate site.  A 5 mm optiview trocar was placed at the left costal margin in order to be able to see better. Pneumoperitoneum was achieved to a pressure of 15 mm Hg.  The  cautery was used to open the fascia/sac at the hernia.  The hasson was inserted into the abdomen and the balloon inflated.      A careful evaluation of the abdomen was carried out. There was no gross pathology.  The patient was placed in Trendelenburg and rotated to the left.  The small intestines were retracted in the cephalad and left lateral direction away from the pelvis and right lower quadrant. The patient was found to have an enlarged appendix in the pelvic position. There was no evidence of perforation.  The appendix was carefully dissected. The appendix was was skeletonized with the harmonic scalpel.   The appendix was divided at its base using an endo-GIA stapler. Minimal appendiceal stump was left in place. The appendix was removed from the abdomen with an Ecosac through the umbilical port.  There was no evidence of bleeding, leakage, or complication after division of the appendix.   0-0 vicryl was used with the suture passer to close the umbilical hernia.  Three interrupted sutures were used and the skin was air tight. This was done under direct visualization to make sure it was closed and no intraabdominal contents were involved.  At this point, the pneumoperitoneum was evacuated.    The trocar site skin wounds were closed with 4-0 Monocryl and dressed with Dermabond.  Instrument, sponge, and needle counts were correct at the conclusion of the case.   Findings: The appendix was found to be dilated.   Estimated Blood Loss:  Minimal         Drains: none          Specimens: appendix to pathology  Complications:  None; patient tolerated the procedure well.         Disposition: PACU - hemodynamically stable.         Condition: stable

## 2022-10-09 ENCOUNTER — Encounter (HOSPITAL_COMMUNITY): Payer: Self-pay | Admitting: General Surgery

## 2022-10-09 LAB — SURGICAL PATHOLOGY

## 2022-11-30 DIAGNOSIS — Z1231 Encounter for screening mammogram for malignant neoplasm of breast: Secondary | ICD-10-CM | POA: Diagnosis not present

## 2022-12-01 ENCOUNTER — Ambulatory Visit: Payer: BC Managed Care – PPO | Admitting: Family Medicine

## 2022-12-31 IMAGING — US US RENAL
1 series · 14 of 25 positions shown · non-contrast
Comparison: October 02, 2020

CLINICAL DATA: Flank pain, history of kidney stones.

EXAM:
RENAL / URINARY TRACT ULTRASOUND COMPLETE

[Series 1: us renal · 0.31mm/px · 14 of 44 slices shown]
[im 1/44]
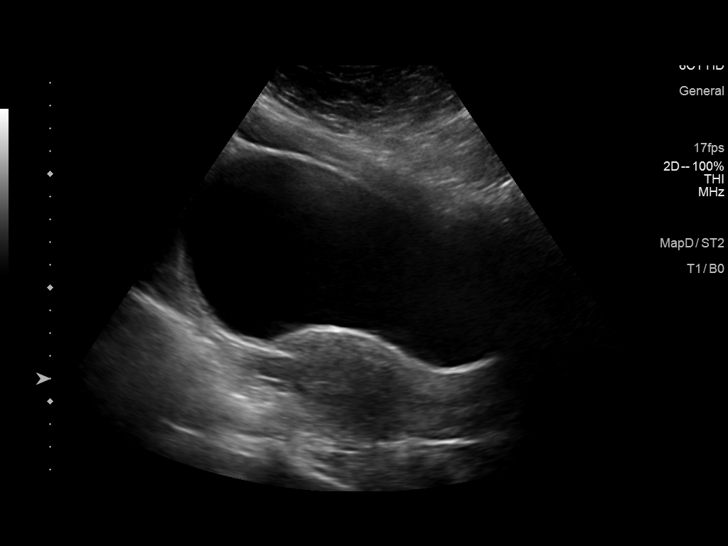
[im 4/44]
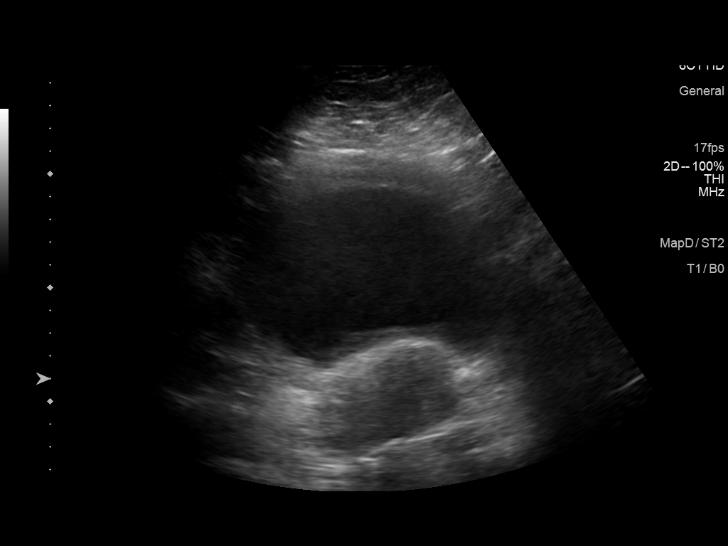
[im 8/44]
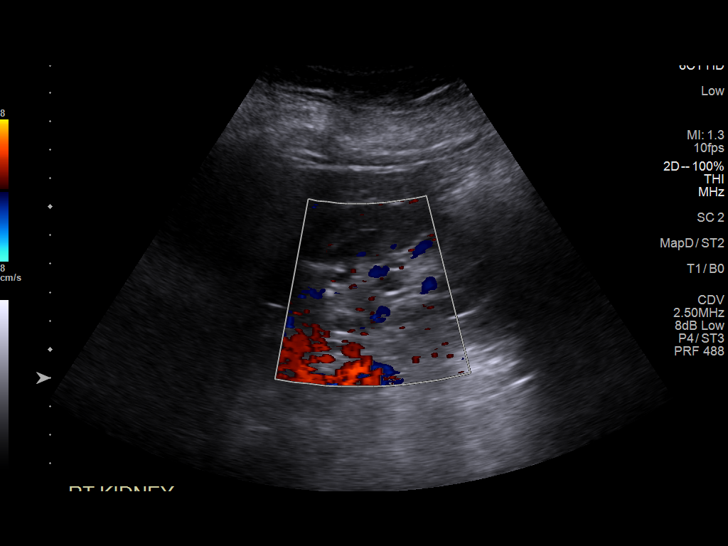
[im 11/44]
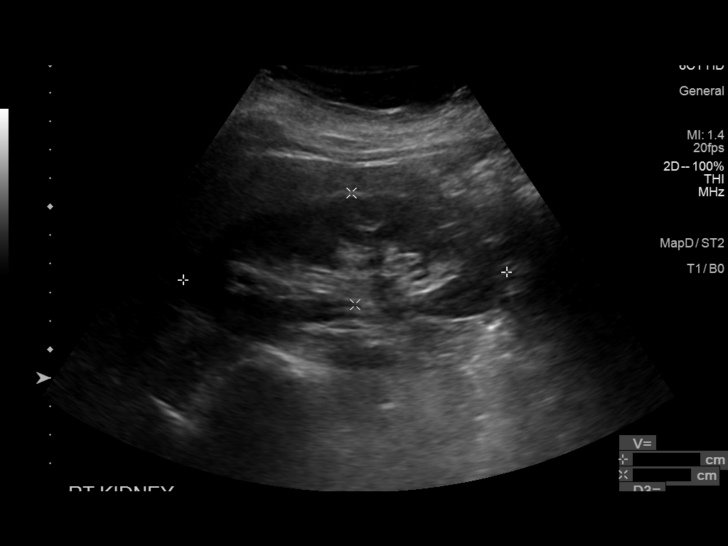
[im 15/44]
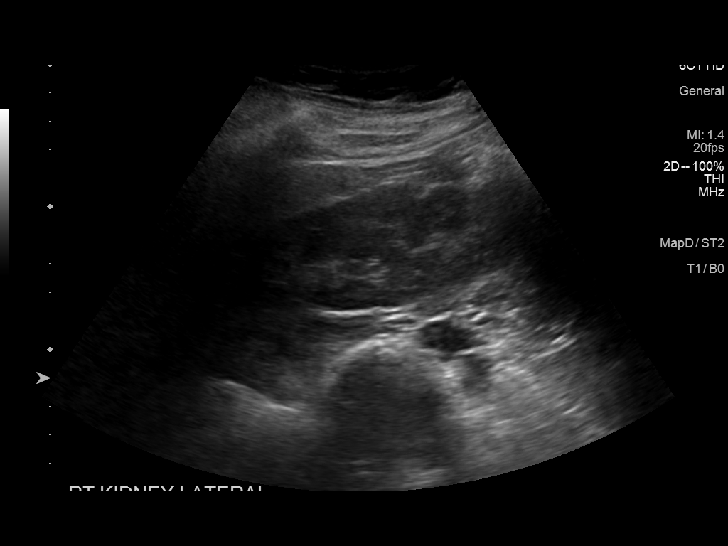
[im 17/44]
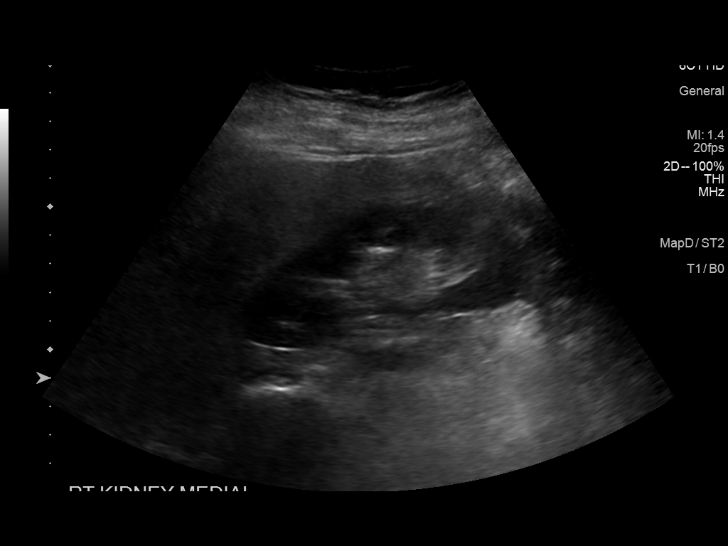
[im 20/44]
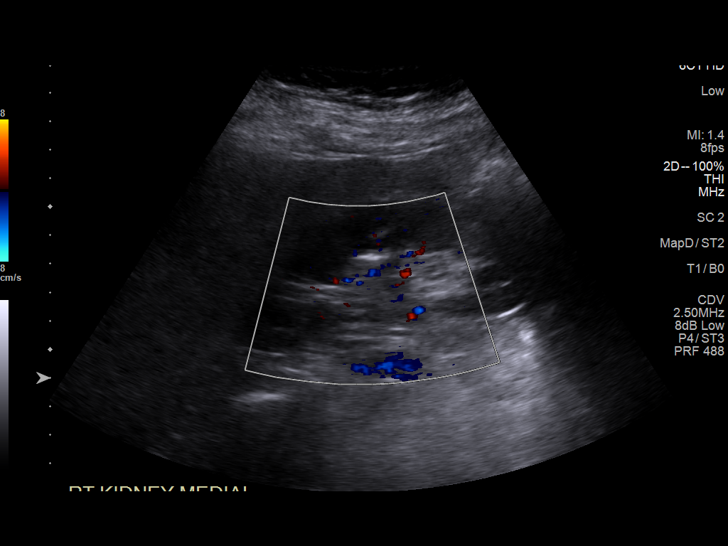
[im 24/44]
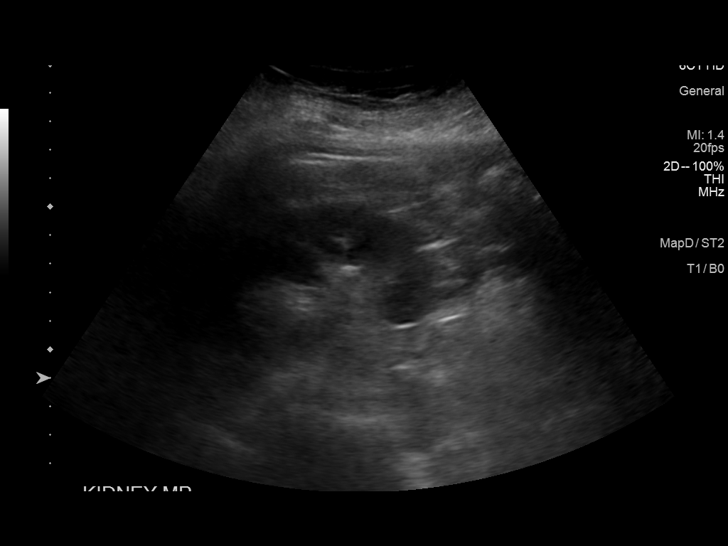
[im 27/44]
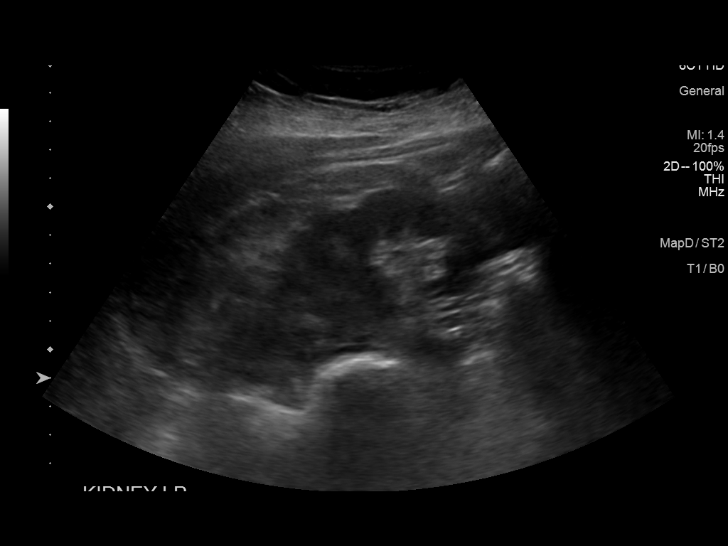
[im 29/44]
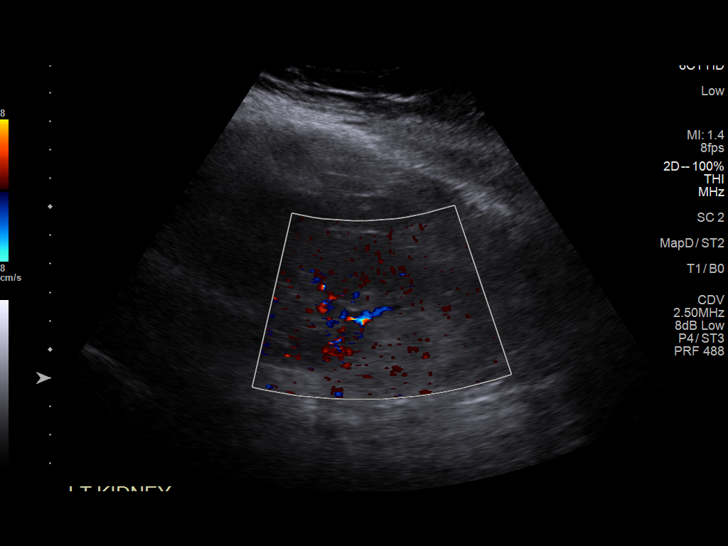
[im 33/44]
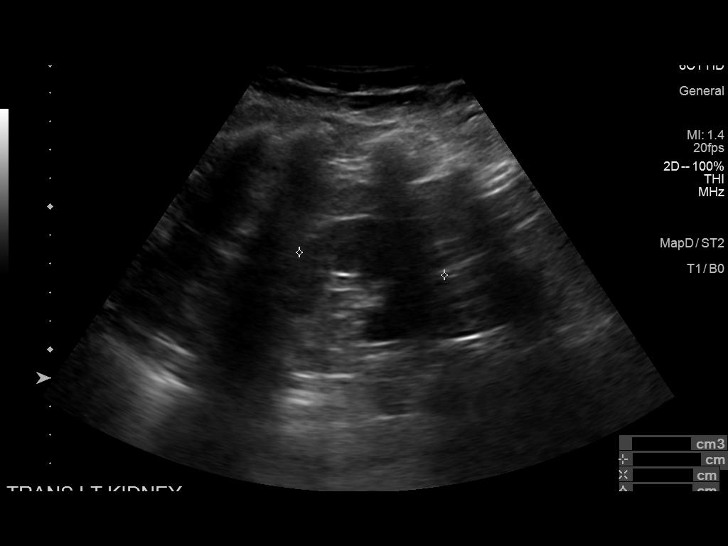
[im 36/44]
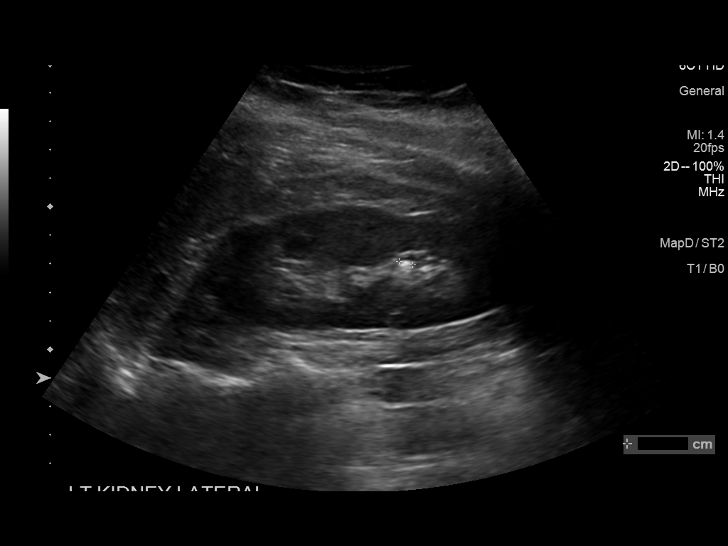
[im 40/44]
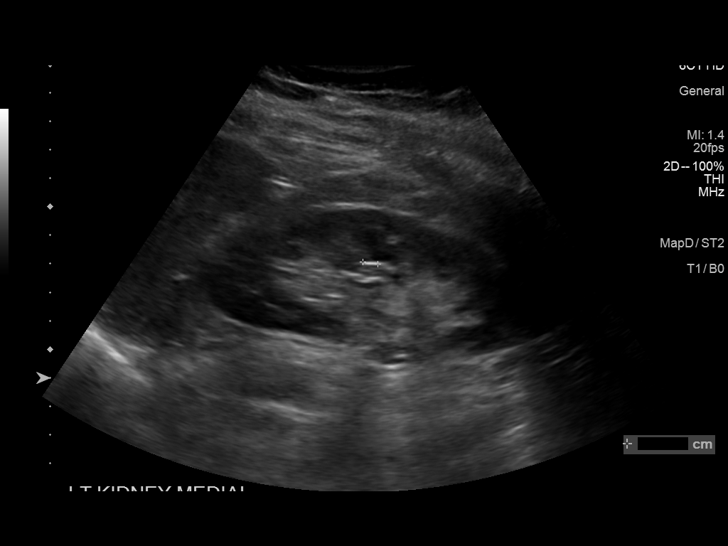
[im 44/44]
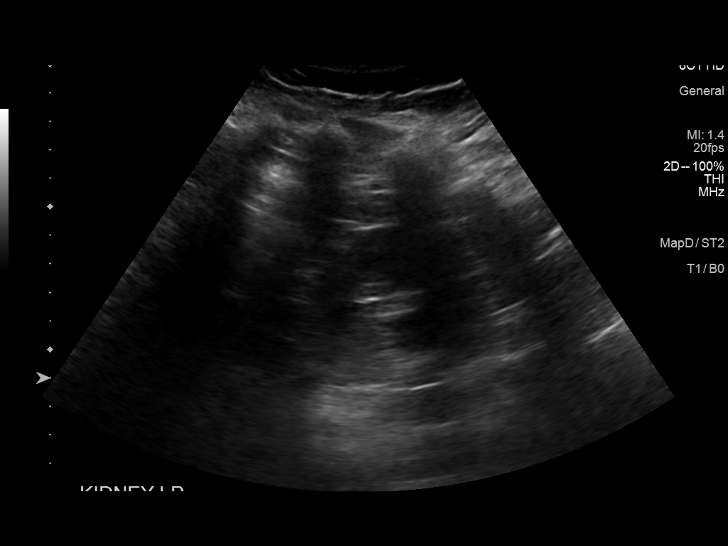

[14 of 25 positions shown; findings below may reference images not displayed]

FINDINGS: Right Kidney:

Renal measurements: 11.3 x 3.9 x 5.4 cm = volume: 125.1 mL.
Echogenicity within normal limits. No mass or hydronephrosis
visualized. A 7 mm nonobstructing stone is identified in the right
kidney.

Left Kidney:

Renal measurements: 12.2 x 4.3 x 5.1 cm = volume: 139 mL.
Echogenicity within normal limits. No mass or hydronephrosis
visualized. A 5.3 mm nonobstructing stone is identified in the left
kidney.

Bladder:

Appears normal for degree of bladder distention.

Other:

None.
IMPRESSION: Small nonobstructing stones identified in both kidneys. No
hydronephrosis is identified bilaterally.

## 2023-03-04 ENCOUNTER — Telehealth: Payer: Self-pay | Admitting: Family Medicine

## 2023-03-04 DIAGNOSIS — R079 Chest pain, unspecified: Secondary | ICD-10-CM | POA: Diagnosis not present

## 2023-03-04 NOTE — Telephone Encounter (Signed)
Patient states: - Been experiencing chest pain x 5 days, throbbing sensation  - Has experienced before last year, but this time felt shoulder and neck pain  Patient has been transferred to triage.

## 2023-03-05 NOTE — Telephone Encounter (Signed)
FYI

## 2023-03-05 NOTE — Telephone Encounter (Signed)
Pt was advised to call EMS  Patient Name: Breanna Byrd [facility] Gender: Female DOB: 1981-04-16 Age: 42 Y 1 M 19 D Return Phone Number: [phone] (Primary) Address: City/ State/ Zip: [place]  [address] Client [facility] [place] Client Site [facility] [place] Day Provider Cherlynn Kaiser, [place] Type Call Who Is Calling Patient / Member / Family / Caregiver Call Type Triage / Clinical Relationship To Patient Self Return Phone Number [phone] (Primary) Chief Complaint CHEST PAIN - pain, pressure, heaviness or tightness Reason for Call Symptomatic / Request for [place] states her patient has been having chest pains for a week now. Translation No Nurse Assessment Nurse: Agustina Caroli, RN, Mendel Ryder Date/Time (Eastern Time): 03/04/2023 4:51:47 PM Confirm and document reason for call. If symptomatic, describe symptoms. ---Caller states her patient has been having chest pains on and off for a week now. Caller states that she has had the pains today but not for a few days prior. Caller states that the pain is throbbing, at the middle top of the chest below the neck. Does the patient have any new or worsening symptoms? ---Yes Will a triage be completed? ---Yes Related visit to physician within the last 2 weeks? ---No Does the PT have any chronic conditions? (i.e. diabetes, asthma, this includes High risk factors for pregnancy, etc.) ---Yes List chronic conditions. ---high cholesterol (does not take meds) Is the patient pregnant or possibly pregnant? (Ask all females between the ages of 14-55) ---No Is this a behavioral health or substance abuse call? ---No Guidelines Guideline Title Affirmed Question Affirmed Notes Nurse Date/Time (Eastern Time) Chest Pain [1] Chest pain lasts > 5 minutes AND [2] age > 93 AND Popejoy, RN, Mendel Ryder 03/04/2023 4:54:26 PM Guidelines Guideline Title Affirmed  Question Affirmed Notes Nurse Date/Time Eilene Ghazi Time) [3] one or more cardiac risk factors (e.g., diabetes, high blood pressure, high cholesterol, smoker, or strong family history of heart disease) Disp. Time Eilene Ghazi Time) Disposition Final User 03/04/2023 4:49:46 PM Send to Urgent Queue Benetta Spar 03/04/2023 4:57:34 PM Call EMS 911 Now Yes Popejoy, RN, Mendel Ryder 03/04/2023 4:58:43 PM 911 Outcome Documentation Popejoy, RN, Mendel Ryder Reason: caller states she will wait and see, undecided if she will call EMS Final Disposition 03/04/2023 4:57:34 PM Call EMS 911 Now Yes Popejoy, RN, Gracy Racer Disagree/Comply Disagree Caller Understands Yes PreDisposition Call Doctor Care Advice Given Per Guideline CALL EMS 911 NOW: * Immediate medical attention is needed. You need to hang up and call 911 (or an ambulance). * Triager Discretion: I'll call you back in a few minutes to be sure you were able to reach them. NOTE TO TRIAGER - IF CALLER ASKS ABOUT ASPIRIN: * Call EMS 911 first. * If the patient is not allergic to aspirin, they can chew an aspirin while waiting for the ambulance to arrive. CARE ADVICE given per Chest Pain (Adult) guideline.

## 2023-03-12 ENCOUNTER — Ambulatory Visit (INDEPENDENT_AMBULATORY_CARE_PROVIDER_SITE_OTHER): Payer: BC Managed Care – PPO | Admitting: Family Medicine

## 2023-03-12 ENCOUNTER — Encounter: Payer: Self-pay | Admitting: Family Medicine

## 2023-03-12 VITALS — BP 112/80 | HR 72 | Temp 98.2°F | Ht 63.0 in | Wt 223.5 lb

## 2023-03-12 DIAGNOSIS — E538 Deficiency of other specified B group vitamins: Secondary | ICD-10-CM | POA: Diagnosis not present

## 2023-03-12 DIAGNOSIS — R7303 Prediabetes: Secondary | ICD-10-CM

## 2023-03-12 DIAGNOSIS — E782 Mixed hyperlipidemia: Secondary | ICD-10-CM | POA: Diagnosis not present

## 2023-03-12 DIAGNOSIS — E559 Vitamin D deficiency, unspecified: Secondary | ICD-10-CM

## 2023-03-12 DIAGNOSIS — R0789 Other chest pain: Secondary | ICD-10-CM

## 2023-03-12 LAB — COMPREHENSIVE METABOLIC PANEL
ALT: 20 U/L (ref 0–35)
AST: 16 U/L (ref 0–37)
Albumin: 4.1 g/dL (ref 3.5–5.2)
Alkaline Phosphatase: 63 U/L (ref 39–117)
BUN: 7 mg/dL (ref 6–23)
CO2: 27 mEq/L (ref 19–32)
Calcium: 9.1 mg/dL (ref 8.4–10.5)
Chloride: 105 mEq/L (ref 96–112)
Creatinine, Ser: 0.64 mg/dL (ref 0.40–1.20)
GFR: 108.44 mL/min (ref >60.00)
Glucose, Bld: 95 mg/dL (ref 70–99)
Potassium: 4.2 mEq/L (ref 3.5–5.1)
Sodium: 140 mEq/L (ref 135–145)
Total Bilirubin: 0.4 mg/dL (ref 0.2–1.2)
Total Protein: 6.7 g/dL (ref 6.0–8.3)

## 2023-03-12 LAB — CBC WITH DIFFERENTIAL/PLATELET
Basophils Absolute: 0 10*3/uL (ref 0.0–0.1)
Basophils Relative: 0.7 % (ref 0.0–3.0)
Eosinophils Absolute: 0.1 10*3/uL (ref 0.0–0.7)
Eosinophils Relative: 2.2 % (ref 0.0–5.0)
HCT: 33.8 % — ABNORMAL LOW (ref 36.0–46.0)
Hemoglobin: 10.6 g/dL — ABNORMAL LOW (ref 12.0–15.0)
Lymphocytes Relative: 28.8 % (ref 12.0–46.0)
Lymphs Abs: 1.4 10*3/uL (ref 0.7–4.0)
MCHC: 31.3 g/dL (ref 30.0–36.0)
MCV: 73.5 fl — ABNORMAL LOW (ref 78.0–100.0)
Monocytes Absolute: 0.4 10*3/uL (ref 0.1–1.0)
Monocytes Relative: 8.6 % (ref 3.0–12.0)
Neutro Abs: 3 10*3/uL (ref 1.4–7.7)
Neutrophils Relative %: 59.7 % (ref 43.0–77.0)
Platelets: 403 10*3/uL — ABNORMAL HIGH (ref 150.0–400.0)
RBC: 4.6 Mil/uL (ref 3.87–5.11)
RDW: 17 % — ABNORMAL HIGH (ref 11.5–15.5)
WBC: 5 10*3/uL (ref 4.0–10.5)

## 2023-03-12 LAB — VITAMIN B12: Vitamin B-12: 149 pg/mL — ABNORMAL LOW (ref 211–911)

## 2023-03-12 LAB — LIPID PANEL
Cholesterol: 229 mg/dL — ABNORMAL HIGH (ref 0–200)
HDL: 54.6 mg/dL (ref >39.00)
LDL Cholesterol: 148 mg/dL — ABNORMAL HIGH (ref 0–99)
NonHDL: 174.53
Total CHOL/HDL Ratio: 4
Triglycerides: 131 mg/dL (ref 0.0–149.0)
VLDL: 26.2 mg/dL (ref 0.0–40.0)

## 2023-03-12 LAB — IBC + FERRITIN
Ferritin: 2.8 ng/mL — ABNORMAL LOW (ref 10.0–291.0)
Iron: 23 ug/dL — ABNORMAL LOW (ref 42–145)
Saturation Ratios: 4.3 % — ABNORMAL LOW (ref 20.0–50.0)
TIBC: 532 ug/dL — ABNORMAL HIGH (ref 250.0–450.0)
Transferrin: 380 mg/dL — ABNORMAL HIGH (ref 212.0–360.0)

## 2023-03-12 LAB — TSH: TSH: 2.31 u[IU]/mL (ref 0.35–5.50)

## 2023-03-12 LAB — VITAMIN D 25 HYDROXY (VIT D DEFICIENCY, FRACTURES): VITD: 7.56 ng/mL — ABNORMAL LOW (ref 30.00–100.00)

## 2023-03-12 LAB — HEMOGLOBIN A1C: Hgb A1c MFr Bld: 6 % (ref 4.6–6.5)

## 2023-03-12 NOTE — Progress Notes (Signed)
Subjective:     Patient ID: Breanna Byrd, female    DOB: 08/23/1981, 42 y.o.   MRN: [id number]  Chief Complaint  Patient presents with   Referral    Discuss referral to cardiology for chest pain she had last week, went to UC    HPI  Last wk, got pain again L chest-getting intermitt, but last wk was while walking and rad more to upper chest.Went to UC.  Chronic DOE-pt thinks from obesity.  Wants to see card.  Stress test in [place] in 2021.  No FH CAD.  Pt has high risk chol-prefers no meds and continues to work on diet/exercise  There are no preventive care reminders to display for this patient.  Past Medical History:  Diagnosis Date   Fatty liver    Hyperlipidemia    Kidney stone     Past Surgical History:  Procedure Laterality Date   COLONOSCOPY  2023   LAPAROSCOPIC APPENDECTOMY N/A 10/08/2022   Procedure: APPENDECTOMY LAPAROSCOPIC;  Surgeon: Stark Klein, MD;  Location: [facility];  Service: General;  Laterality: N/A;   UMBILICAL HERNIA REPAIR  10/08/2022   Procedure: HERNIA REPAIR UMBILICAL ADULT;  Surgeon: Stark Klein, MD;  Location: [facility];  Service: General;;    Outpatient Medications Prior to Visit  Medication Sig Dispense Refill   acetaminophen (TYLENOL) 500 MG tablet Take 500-1,000 mg by mouth every 6 (six) hours as needed (for pain.).     oxyCODONE (OXY IR/ROXICODONE) 5 MG immediate release tablet Take 1 tablet (5 mg total) by mouth every 6 (six) hours as needed for severe pain. 15 tablet 0   No facility-administered medications prior to visit.    No Known Allergies ROS neg/noncontributory except as noted HPI/below      Objective:     BP 112/80   Pulse 72   Temp 98.2 F (36.8 C) (Temporal)   Ht 5\' 3"  (1.6 m)   Wt 223 lb 8 oz (101.4 kg)   LMP 02/27/2023 (Exact Date)   SpO2 99%   BMI 39.59 kg/m  Wt Readings from Last 3 Encounters:  03/12/23 223 lb 8 oz (101.4 kg)  10/08/22 220 lb (99.8 kg)  10/02/22 220 lb 4.8 oz (99.9 kg)    Physical Exam    Gen: WDWN NAD HEENT: NCAT, conjunctiva not injected, sclera nonicteric NECK:  supple, no thyromegaly, no nodes, no carotid bruits CARDIAC: RRR, S1S2+, no murmur. DP 2+B LUNGS: CTAB. No wheezes ABDOMEN:  BS+, soft, NTND, No HSM, no masses EXT:  no edema MSK: no gross abnormalities.  NEURO: A&O x3.  CN II-XII intact.  PSYCH: normal mood. Good eye contact     Assessment & Plan:   Problem List Items Addressed This Visit       Other   Hyperlipidemia, mixed - Primary   Relevant Orders   Comprehensive metabolic panel   Lipid panel   TSH   Ambulatory referral to Cardiology   B12 deficiency   Relevant Orders   CBC with Differential/Platelet   Vitamin B12   IBC + Ferritin   Vitamin D deficiency   Relevant Orders   VITAMIN D 25 Hydroxy (Vit-D Deficiency, Fractures)   Prediabetes   Relevant Orders   Comprehensive metabolic panel   Hemoglobin A1c   Other Visit Diagnoses     Other chest pain       Relevant Orders   Ambulatory referral to Cardiology   DG Chest 2 View      Chest pain-may be muscular, cardiac,  other.  No FH CAD.  Pt has HLD not on meds. Treadmill test in [place] in 2021   will refer Card. HLD-chronic. Not controlled  Pt prefers to cont diet/exercise.  Check lipids,cmp,tsh PreDM-working on diet/exercise-check A1C, cmp B12 deficiency-pt vegetarian-check B12,iron,cbc(heavy menses as well) Vitamin d def-check D F/u 6 mo-cpx  No orders of the defined types were placed in this encounter.   Wellington Hampshire, MD

## 2023-03-12 NOTE — Patient Instructions (Signed)
get X-ray/labs at [facility].  [place]  hours 8=M-F 8:30-5.  closed 12:30-1 lunch

## 2023-03-14 ENCOUNTER — Encounter (HOSPITAL_COMMUNITY): Payer: Self-pay

## 2023-03-14 ENCOUNTER — Emergency Department (HOSPITAL_COMMUNITY)
Admission: EM | Admit: 2023-03-14 | Discharge: 2023-03-15 | Disposition: A | Discharge status: Home / Self Care | Payer: BC Managed Care – PPO | Attending: Emergency Medicine | Admitting: Emergency Medicine

## 2023-03-14 ENCOUNTER — Emergency Department (HOSPITAL_COMMUNITY): Payer: BC Managed Care – PPO

## 2023-03-14 ENCOUNTER — Other Ambulatory Visit: Payer: Self-pay

## 2023-03-14 DIAGNOSIS — R569 Unspecified convulsions: Secondary | ICD-10-CM | POA: Diagnosis not present

## 2023-03-14 DIAGNOSIS — Z87898 Personal history of other specified conditions: Secondary | ICD-10-CM

## 2023-03-14 DIAGNOSIS — W19XXXA Unspecified fall, initial encounter: Secondary | ICD-10-CM | POA: Insufficient documentation

## 2023-03-14 DIAGNOSIS — R079 Chest pain, unspecified: Secondary | ICD-10-CM | POA: Diagnosis not present

## 2023-03-14 DIAGNOSIS — R404 Transient alteration of awareness: Secondary | ICD-10-CM | POA: Diagnosis not present

## 2023-03-14 DIAGNOSIS — R55 Syncope and collapse: Secondary | ICD-10-CM | POA: Insufficient documentation

## 2023-03-14 DIAGNOSIS — R0789 Other chest pain: Secondary | ICD-10-CM | POA: Diagnosis not present

## 2023-03-14 DIAGNOSIS — Z043 Encounter for examination and observation following other accident: Secondary | ICD-10-CM | POA: Diagnosis not present

## 2023-03-14 HISTORY — DX: Personal history of other specified conditions: Z87.898

## 2023-03-14 LAB — CBC WITH DIFFERENTIAL/PLATELET
Abs Immature Granulocytes: 0.03 10*3/uL (ref 0.00–0.07)
Basophils Absolute: 0 10*3/uL (ref 0.0–0.1)
Basophils Relative: 0 %
Eosinophils Absolute: 0.1 10*3/uL (ref 0.0–0.5)
Eosinophils Relative: 1 %
HCT: 35.8 % — ABNORMAL LOW (ref 36.0–46.0)
Hemoglobin: 10.8 g/dL — ABNORMAL LOW (ref 12.0–15.0)
Immature Granulocytes: 0 %
Lymphocytes Relative: 24 %
Lymphs Abs: 2.3 10*3/uL (ref 0.7–4.0)
MCH: 22.6 pg — ABNORMAL LOW (ref 26.0–34.0)
MCHC: 30.2 g/dL (ref 30.0–36.0)
MCV: 75.1 fL — ABNORMAL LOW (ref 80.0–100.0)
Monocytes Absolute: 0.8 10*3/uL (ref 0.1–1.0)
Monocytes Relative: 9 %
Neutro Abs: 6.2 10*3/uL (ref 1.7–7.7)
Neutrophils Relative %: 66 %
Platelets: 390 10*3/uL (ref 150–400)
RBC: 4.77 MIL/uL (ref 3.87–5.11)
RDW: 16.5 % — ABNORMAL HIGH (ref 11.5–15.5)
WBC: 9.5 10*3/uL (ref 4.0–10.5)
nRBC: 0 % (ref 0.0–0.2)

## 2023-03-14 LAB — COMPREHENSIVE METABOLIC PANEL
ALT: 24 U/L (ref 0–44)
AST: 23 U/L (ref 15–41)
Albumin: 4.1 g/dL (ref 3.5–5.0)
Alkaline Phosphatase: 68 U/L (ref 38–126)
Anion gap: 11 (ref 5–15)
BUN: <5 mg/dL — ABNORMAL LOW (ref 6–20)
CO2: 24 mmol/L (ref 22–32)
Calcium: 9.4 mg/dL (ref 8.9–10.3)
Chloride: 101 mmol/L (ref 98–111)
Creatinine, Ser: 0.71 mg/dL (ref 0.44–1.00)
GFR, Estimated: >60 mL/min (ref >60)
Glucose, Bld: 102 mg/dL — ABNORMAL HIGH (ref 70–99)
Potassium: 3.7 mmol/L (ref 3.5–5.1)
Sodium: 136 mmol/L (ref 135–145)
Total Bilirubin: 0.5 mg/dL (ref 0.3–1.2)
Total Protein: 7.2 g/dL (ref 6.5–8.1)

## 2023-03-14 LAB — I-STAT BETA HCG BLOOD, ED (MC, WL, AP ONLY): I-stat hCG, quantitative: <5 m[IU]/mL (ref <5)

## 2023-03-14 LAB — TROPONIN I (HIGH SENSITIVITY): Troponin I (High Sensitivity): 5 ng/L (ref <18)

## 2023-03-14 NOTE — Progress Notes (Signed)
Cholesterol still high-await cardiology input 2.  B12 is very low-does she want to do injections or take B12 vitamins 1000 mcg/d(verify if taking now) 3.  Vitamin d is VERY low-take 50,000 twice weekly for 4 wks, then weekly for 2 more months.   4.  A1C(3 month average of sugars) is elevated.  This is considered PreDiabetes.  Work on diet-decrease sugars and starches and aim for 30 minutes of exercise 5 days/week to prevent progression to diabetes  5.  Anemia sl worse and iron very low-needs to take iron 325mg  daily otc and may need stool softener  Sch appt in 3 mo to follow up with all this and repeat labs.

## 2023-03-14 NOTE — ED Notes (Signed)
Husband Breanna Byrd [phone] would like an update asap

## 2023-03-14 NOTE — ED Triage Notes (Signed)
Patient [facility]  secondary to fall and loc. Witness seizure on route, lasted 20 sec, no meds administered , a/o x2. No history of seizures.

## 2023-03-15 LAB — TROPONIN I (HIGH SENSITIVITY): Troponin I (High Sensitivity): 6 ng/L (ref <18)

## 2023-03-15 NOTE — Discharge Instructions (Signed)
No driving until cleared by Neurology 

## 2023-03-15 NOTE — ED Provider Notes (Signed)
[facility] Provider Note   CSN: [id number] Arrival date & time: 03/14/23  2128     History  Chief Complaint  Patient presents with   Fall   Seizures    Breanna Byrd is a 42 y.o. female.   Fall  Seizures Patient presents with fall and syncope.  Was [place] the [place] and went to the bathroom and then reportedly passed out.  Has not passed out before.  Per EMS reportedly had seizure for them.  Reportedly eyes were having repetitive motions.  Patient now confused and does not really remember event.  No major injury.    Past Medical History:  Diagnosis Date   Fatty liver    Hyperlipidemia    Kidney stone     Home Medications Prior to Admission medications   Not on File      Allergies    Patient has no known allergies.    Review of Systems   Review of Systems  Neurological:  Positive for seizures.    Physical Exam Updated Vital Signs BP 128/75   Pulse 72   Temp 98.1 F (36.7 C) (Oral)   Resp 18   Ht 5\' 3"  (1.6 m)   Wt 99.8 kg   LMP 02/27/2023 (Exact Date)   SpO2 100%   BMI 38.97 kg/m  Physical Exam Vitals and nursing note reviewed.  HENT:     Head: Atraumatic.  Pulmonary:     Breath sounds: No wheezing or rhonchi.  Abdominal:     Tenderness: There is no abdominal tenderness.  Musculoskeletal:        General: No tenderness.     Cervical back: Neck supple. No tenderness.  Skin:    General: Skin is warm.  Neurological:     Mental Status: She is alert.     Comments: Awake and appropriate, confused to events.  Moving all extremities freely.       ED Results / Procedures / Treatments   Labs (all labs ordered are listed, but only abnormal results are displayed) Labs Reviewed  COMPREHENSIVE METABOLIC PANEL - Abnormal; Notable for the following components:      Result Value   Glucose, Bld 102 (*)    BUN <5 (*)    All other components within normal limits  CBC WITH DIFFERENTIAL/PLATELET - Abnormal;  Notable for the following components:   Hemoglobin 10.8 (*)    HCT 35.8 (*)    MCV 75.1 (*)    MCH 22.6 (*)    RDW 16.5 (*)    All other components within normal limits  ETHANOL  RAPID URINE DRUG SCREEN, HOSP PERFORMED  I-STAT BETA HCG BLOOD, ED ([facility], [facility], [facility] ONLY)  TROPONIN I (HIGH SENSITIVITY)  TROPONIN I (HIGH SENSITIVITY)    EKG EKG Interpretation  Date/Time:  Sunday March 14 2023 21:39:21 EDT Ventricular Rate:  80 PR Interval:  120 QRS Duration: 92 QT Interval:  384 QTC Calculation: 443 R Axis:   75 Text Interpretation: Sinus rhythm Abnormal R-wave progression, early transition Confirmed by Davonna Belling [phone]) on 03/14/2023 10:13:58 PM  Radiology CT HEAD WO CONTRAST (5MM)  Result Date: 03/14/2023 CLINICAL DATA:  Recent fall and seizure activity, initial encounter EXAM: CT HEAD WITHOUT CONTRAST CT CERVICAL SPINE WITHOUT CONTRAST TECHNIQUE: Multidetector CT imaging of the head and cervical spine was performed following the standard protocol without intravenous contrast. Multiplanar CT image reconstructions of the cervical spine were also generated. RADIATION DOSE REDUCTION: This exam was performed according  to the departmental dose-optimization program which includes automated exposure control, adjustment of the mA and/or kV according to patient size and/or use of iterative reconstruction technique. COMPARISON:  None Available. FINDINGS: CT HEAD FINDINGS Brain: No evidence of acute infarction, hemorrhage, hydrocephalus, extra-axial collection or mass lesion/mass effect. Vascular: No hyperdense vessel or unexpected calcification. Skull: Normal. Negative for fracture or focal lesion. Sinuses/Orbits: Mucosal retention cysts are noted within the right maxillary antrum. Other: None CT CERVICAL SPINE FINDINGS Alignment: Within normal limits. Skull base and vertebrae: 7 cervical segments are well visualized. Vertebral body height is well maintained. Mild facet hypertrophic changes are  noted. No acute fracture or acute facet abnormality is noted. The odontoid is within normal limits. Soft tissues and spinal canal: Surrounding soft tissue structures are within normal limits. Upper chest: Visualized lung apices are unremarkable. Other: None IMPRESSION: CT of the head: No acute intracranial abnormality is noted. Right maxillary mucosal retention cyst. CT of the cervical spine: Mild degenerative changes without acute abnormality. Electronically Signed   By: Inez Catalina M.D.   On: 03/14/2023 23:20   CT Cervical Spine Wo Contrast  Result Date: 03/14/2023 CLINICAL DATA:  Recent fall and seizure activity, initial encounter EXAM: CT HEAD WITHOUT CONTRAST CT CERVICAL SPINE WITHOUT CONTRAST TECHNIQUE: Multidetector CT imaging of the head and cervical spine was performed following the standard protocol without intravenous contrast. Multiplanar CT image reconstructions of the cervical spine were also generated. RADIATION DOSE REDUCTION: This exam was performed according to the departmental dose-optimization program which includes automated exposure control, adjustment of the mA and/or kV according to patient size and/or use of iterative reconstruction technique. COMPARISON:  None Available. FINDINGS: CT HEAD FINDINGS Brain: No evidence of acute infarction, hemorrhage, hydrocephalus, extra-axial collection or mass lesion/mass effect. Vascular: No hyperdense vessel or unexpected calcification. Skull: Normal. Negative for fracture or focal lesion. Sinuses/Orbits: Mucosal retention cysts are noted within the right maxillary antrum. Other: None CT CERVICAL SPINE FINDINGS Alignment: Within normal limits. Skull base and vertebrae: 7 cervical segments are well visualized. Vertebral body height is well maintained. Mild facet hypertrophic changes are noted. No acute fracture or acute facet abnormality is noted. The odontoid is within normal limits. Soft tissues and spinal canal: Surrounding soft tissue structures  are within normal limits. Upper chest: Visualized lung apices are unremarkable. Other: None IMPRESSION: CT of the head: No acute intracranial abnormality is noted. Right maxillary mucosal retention cyst. CT of the cervical spine: Mild degenerative changes without acute abnormality. Electronically Signed   By: Inez Catalina M.D.   On: 03/14/2023 23:20    Procedures Procedures    Medications Ordered in ED Medications - No data to display  ED Course/ Medical Decision Making/ A&P                             Medical Decision Making Amount and/or Complexity of Data Reviewed Labs: ordered. Radiology: ordered.   Patient presents with syncope.  I reviewed the video that was later available.  Discussed with family members.  Reportedly gone to the bathroom and then passed out.  Did potentially have seizure activity for EMS although I do not witness it.  With first episode of potential seizure will not treat, however instructed on not driving.  Head CT reassuring.  Blood work reassuring.  EKG reassuring.  Troponin negative will discharge home.        Final Clinical Impression(s) / ED Diagnoses Final diagnoses:  Syncope, unspecified syncope  type  Seizure [facility])    Rx / DC Orders ED Discharge Orders     None         Davonna Belling, MD 03/15/23 [phone]

## 2023-03-16 ENCOUNTER — Other Ambulatory Visit: Payer: Self-pay | Admitting: *Deleted

## 2023-03-16 ENCOUNTER — Encounter: Payer: Self-pay | Admitting: Family Medicine

## 2023-03-16 MED ORDER — VITAMIN D (ERGOCALCIFEROL) 1.25 MG (50000 UNIT) PO CAPS: ORAL_CAPSULE | ORAL | 3 refills | Status: DC

## 2023-03-16 NOTE — Addendum Note (Signed)
Addended by: Zacarias Pontes on: 03/16/2023 12:57 PM   Modules accepted: Orders

## 2023-03-16 NOTE — Telephone Encounter (Signed)
Pt's husband called for an update. Informed him that message was received shortly prior to phone call. States this is urgent.

## 2023-03-16 NOTE — Addendum Note (Signed)
Addended by: Zacarias Pontes on: 03/16/2023 10:15 AM   Modules accepted: Orders

## 2023-03-17 ENCOUNTER — Telehealth: Payer: Self-pay

## 2023-03-17 NOTE — Transitions of Care (Post Inpatient/ED Visit) (Signed)
   03/17/2023  Name: Breanna Byrd MRN: [id number] DOB: [birth year]  Today's TOC FU Call Status: Today's TOC FU Call Status:: Unsuccessul Call (1st Attempt) Unsuccessful Call (1st Attempt) Date: 03/17/23 Scarlett Presto on EMMI-ED Discharge Alert Date & Reason:  03/16/23 "Scheduled follow-up appt? No")  Attempted to reach the patient regarding the most recent Inpatient/ED visit.  Follow Up Plan: Additional outreach attempts will be made to reach the patient to complete the Transitions of Care (Post Inpatient/ED visit) call.     Enzo Montgomery, RN,BSN,CCM [facility] Care Management Care Management Community Coordinator Direct Phone: [phone] Toll Free: [phone] Fax: [phone]

## 2023-03-18 ENCOUNTER — Telehealth: Payer: Self-pay

## 2023-03-18 ENCOUNTER — Telehealth: Payer: Self-pay | Admitting: Family Medicine

## 2023-03-18 DIAGNOSIS — Z0279 Encounter for issue of other medical certificate: Secondary | ICD-10-CM

## 2023-03-18 NOTE — Transitions of Care (Post Inpatient/ED Visit) (Signed)
   03/18/2023  Name: Breanna Byrd MRN: [id number] DOB: 1981/03/19  Today's TOC FU Call Status: Today's TOC FU Call Status:: Successful TOC FU Call Competed TOC FU Call Complete Date: 03/18/23 (Incoming call from pt returning RN CM call.)  Transition Care Management Follow-up Telephone Call Date of Discharge: 03/15/23 Discharge Facility: [facility]) Type of Discharge: Emergency Department Reason for ED Visit: Other: ("syncope,unpsecified,seizure") How have you been since you were released from the hospital?: Better (patient states she is doing well-denies any acute issues/cocncerns at present) Any questions or concerns?: No  Items Reviewed: Did you receive and understand the discharge instructions provided?: Yes Medications obtained and verified?: Yes (Medications Reviewed) Any new allergies since your discharge?: No Dietary orders reviewed?: NA Do you have support at home?: Yes People in Home: spouse Name of Support/Comfort Primary Source: spouse  Home Care and Equipment/Supplies: [place] Ordered?: NA Any new equipment or medical supplies ordered?: NA  Functional Questionnaire: Do you need assistance with bathing/showering or dressing?: No Do you need assistance with meal preparation?: No Do you need assistance with eating?: No Do you have difficulty maintaining continence: No Do you need assistance with getting out of bed/getting out of a chair/moving?: No Do you have difficulty managing or taking your medications?: No   Red on EMMI-ED Discharge Alert Date & Reason: 03/16/23 "Scheduled follow-up appt? No"-Confirmed with patient follow up appts in place.  Follow up appointments reviewed: PCP Follow-up appointment confirmed?: Yes Date of PCP follow-up appointment?: 03/22/23 Follow-up Provider: Dr. Cherlynn [facility] Specialist [facility] Follow-up appointment confirmed?: Yes Date of Specialist follow-up appointment?: 03/22/23 Follow-Up Specialty Provider:: Dr.  Westley Hummer Do you need transportation to your follow-up appointment?: No Do you understand care options if your condition(s) worsen?: Yes-patient verbalized understanding  SDOH Interventions Today    Flowsheet Row Most Recent Value  SDOH Interventions   Food Insecurity Interventions Intervention Not Indicated  Transportation Interventions Intervention Not Indicated      TOC Interventions Today    Flowsheet Row Most Recent Value  TOC Interventions   TOC Interventions Discussed/Reviewed TOC Interventions Discussed      Interventions Today    Flowsheet Row Most Recent Value  General Interventions   General Interventions Discussed/Reviewed General Interventions Discussed, Doctor Visits  Doctor Visits Discussed/Reviewed Doctor Visits Discussed, PCP, Specialist  PCP/Specialist Visits Compliance with follow-up visit  Education Interventions   Education Provided Provided Education  Provided Verbal Education On When to see the doctor  Nutrition Interventions   Nutrition Discussed/Reviewed Nutrition Discussed  Pharmacy Interventions   Pharmacy Dicussed/Reviewed Pharmacy Topics Discussed  Safety Interventions   Safety Discussed/Reviewed Safety Discussed       Hetty Blend [facility] Care Management Care Management Community Coordinator Direct Phone: [phone] Toll Free: [phone] Fax: [phone]

## 2023-03-18 NOTE — Transitions of Care (Post Inpatient/ED Visit) (Signed)
   03/18/2023  Name: Breanna Byrd MRN: [id number] DOB: Aug 22, 1981  Today's TOC FU Call Status: Today's TOC FU Call Status:: Unsuccessful Call (2nd Attempt) Unsuccessful Call (2nd Attempt) Date: 03/18/23  Attempted to reach the patient regarding the most recent Inpatient/ED visit.  Follow Up Plan: No further outreach attempts will be made at this time. We have been unable to contact the patient.    Enzo Montgomery, RN,BSN,CCM [facility] Care Management Care Management Community Coordinator Direct Phone: [phone] Toll Free: [phone] Fax: [phone]

## 2023-03-18 NOTE — Transitions of Care (Post Inpatient/ED Visit) (Signed)
   03/18/2023  Name: Rewa Deckelman MRN: [id number] DOB: 03/20/1981  Today's TOC FU Call Status: Today's TOC FU Call Status:: Unsuccessful Call (3rd Attempt) Unsuccessful Call (3rd Attempt) Date: 03/18/23  Attempted to reach the patient regarding the most recent Inpatient/ED visit.  Follow Up Plan: No further outreach attempts will be made at this time. We have been unable to contact the patient.    Enzo Montgomery, RN,BSN,CCM [facility] Care Management Care Management Community Coordinator Direct Phone: [phone] Toll Free: [phone] Fax: [phone]

## 2023-03-18 NOTE — Telephone Encounter (Signed)
Type of form received:FMLA   Additional comments:   Received [id number]  Form should be Faxed to:  Form should be mailed to:    Is patient requesting call for pickup: Yes    Form placed:  providers folder   Attach charge sheet. Yes    Individual made aware of 3-5 business day turn around (Y/N)? Yes

## 2023-03-18 NOTE — Telephone Encounter (Signed)
Form placed on provider's desk for review and signature. 

## 2023-03-22 ENCOUNTER — Ambulatory Visit (INDEPENDENT_AMBULATORY_CARE_PROVIDER_SITE_OTHER): Payer: BC Managed Care – PPO | Admitting: Neurology

## 2023-03-22 ENCOUNTER — Ambulatory Visit (INDEPENDENT_AMBULATORY_CARE_PROVIDER_SITE_OTHER): Payer: BC Managed Care – PPO | Admitting: Family Medicine

## 2023-03-22 ENCOUNTER — Encounter: Payer: Self-pay | Admitting: Neurology

## 2023-03-22 ENCOUNTER — Encounter: Payer: Self-pay | Admitting: Family Medicine

## 2023-03-22 VITALS — Ht 64.0 in | Wt 223.2 lb

## 2023-03-22 VITALS — BP 103/65 | HR 76 | Temp 98.3°F | Ht 64.0 in | Wt 223.0 lb

## 2023-03-22 DIAGNOSIS — E860 Dehydration: Secondary | ICD-10-CM

## 2023-03-22 DIAGNOSIS — E538 Deficiency of other specified B group vitamins: Secondary | ICD-10-CM | POA: Diagnosis not present

## 2023-03-22 DIAGNOSIS — R0602 Shortness of breath: Secondary | ICD-10-CM | POA: Diagnosis not present

## 2023-03-22 DIAGNOSIS — R0789 Other chest pain: Secondary | ICD-10-CM | POA: Diagnosis not present

## 2023-03-22 DIAGNOSIS — D508 Other iron deficiency anemias: Secondary | ICD-10-CM | POA: Diagnosis not present

## 2023-03-22 DIAGNOSIS — R55 Syncope and collapse: Secondary | ICD-10-CM | POA: Diagnosis not present

## 2023-03-22 DIAGNOSIS — R7303 Prediabetes: Secondary | ICD-10-CM

## 2023-03-22 DIAGNOSIS — E559 Vitamin D deficiency, unspecified: Secondary | ICD-10-CM

## 2023-03-22 MED ORDER — METFORMIN HCL 500 MG PO TABS: 500.0000 mg | ORAL_TABLET | Freq: Every day | ORAL | 1 refills | Status: DC

## 2023-03-22 MED ORDER — CYANOCOBALAMIN 1000 MCG/ML IJ SOLN
1000.0000 ug | Freq: Once | INTRAMUSCULAR | Status: AC
  Administered 2023-03-22: 1000 ug via INTRAMUSCULAR

## 2023-03-22 NOTE — Progress Notes (Signed)
Subjective:     Patient ID: Breanna Byrd, female    DOB: December 16, 1981, 42 y.o.   MRN: [id number]  Chief Complaint  Patient presents with   Follow-up    Pt here for ER f/u due to passing out, pt did go see neurologist today. Pt wants to talk about starting b12 injections    HPI-here w/husband  Syncope 1 week(s) ago.  Saw neurology today.  Ordered magnetic resonance imaging and EEG ordered.  Was seen in the emergency room on 03/14/2023 for syncopal event.  That day, ate breakfast, didn't drink much water. "It was a long day"-busy all day.  Felt dryness in mouth and felt tired in the morning.  [place].  Had been "running around" at about 8:15 PM, she was walking out of the temple into the outdoors and fell to the ground.  Didn't respond to people calling her name and shaking her..  Saw video.  She was exiting the door and just fell to the ground.  Does not look like she hit her head.  Per bystanders she was-"out" several minutes, but then alert, but not talking/remembering for about 2 hrs.  Had put colors on face in am. Had eaten a little at 330. Marland Kitchen  No jerking  no history of. This.  No incontinence. Yesterday riking home from [place] dizzy as not eaten for awhile and then snack and some better then lunch.   Gets tired when walking at times.  Walked up stairs and got so fatigued so had to stop and rest and did not continue [place] to the [place]..  She has been out of work since the syncope.  She was told not to drive until the EEG and MRI are done.  She sees cardiology in 2 days.  2.  Anemia no heavy menses.  No dark stools.  Does feel tired during periods.  She is vegetarian so has not been taking supplements up until 5 days ago  There are no preventive care reminders to display for this patient.  Past Medical History:  Diagnosis Date   Fatty liver    Hyperlipidemia    Kidney stone     Past Surgical History:  Procedure Laterality  Date   COLONOSCOPY  2023   LAPAROSCOPIC APPENDECTOMY N/A 10/08/2022   Procedure: APPENDECTOMY LAPAROSCOPIC;  Surgeon: Stark Klein, MD;  Location: [facility];  Service: General;  Laterality: N/A;   UMBILICAL HERNIA REPAIR  10/08/2022   Procedure: HERNIA REPAIR UMBILICAL ADULT;  Surgeon: Stark Klein, MD;  Location: [facility];  Service: General;;    Outpatient Medications Prior to Visit  Medication Sig Dispense Refill   Vitamin D, Ergocalciferol, (DRISDOL) 1.25 MG (50000 UNIT) CAPS capsule twice weekly for 4 wks, then weekly for 2 more months. 14 capsule 3   No facility-administered medications prior to visit.    No Known Allergies ROS neg/noncontributory except as noted HPI/below      Objective:     BP 103/65 (BP Location: Left Arm, Patient Position: Sitting)   Pulse 76   Temp 98.3 F (36.8 C) (Temporal)   Ht 5\' 4"  (1.626 m)   Wt 223 lb (101.2 kg)   LMP 02/27/2023 (Exact Date)   SpO2 99%   BMI 38.28 kg/m  Wt Readings from Last 3 Encounters:  03/22/23 223 lb (101.2 kg)  03/22/23 223 lb 3.2 oz (101.2 kg)  03/14/23 220 lb (99.8 kg)    Physical Exam  Gen: WDWN NAD HEENT: NCAT, conjunctiva not injected, sclera nonicteric NECK:  supple, no thyromegaly, no nodes, no carotid bruits CARDIAC: RRR, S1S2+, no murmur. DP 2+B LUNGS: CTAB. No wheezes EXT:  no edema MSK: no gross abnormalities.  NEURO: A&O x3.  CN II-XII intact.  PSYCH: normal mood. Good eye contact  Reviewd ER notes, neurology notes, answered mult questions.  Spent 50 minimal w/patient's and husband     Assessment & Plan:   Problem List Items Addressed This Visit       Other   B12 deficiency   Vitamin D deficiency   Prediabetes   Syncope - Primary   Iron deficiency anemia secondary to inadequate dietary iron intake  1.  Syncope -question if due to dehydration, not eating much possibly causing hypoglycemia, cardiac, mild anemia, other.  Doubt seizure.  Saw neurology.  Will be undergoing EEG and MRI.   Monitor closely.  Stay hydrated.  Follow-up with cardiology in 2 days. 2.  Prediabetes -patient struggles with weight.  Discussed diet.  Hold on exercise until cleared from cardiology.  Will refer to nutritionist.  3.  Iron deficiency anemia-not sure if due to nutritional deficiency, heavier menses, GI, other.  Taking iron 325 mg daily.  Add vitamin C at 1000 mg to help with absorption.  Recheck CBC and iron studies in 1 month. 4.  Vitamin B12 deficiency-suspect more mood nutritional as patient is vegetarian.  Due to all of the above problems, will start with B12 injections weekly x 4 weeks (first given today) followed by monthly.  Follow-up in 3 months for reevaluation. 5.  Vitamin D deficiency-patient started on 50,000 IUs weekly.  Will reassess in 3 months. 6.  Atypical chest pain, but some decreased exercise tolerance.  She has follow-up with cardiology in 2 days.  Advised no exercise and limit activity until seen by cardiology and cleared. 7.  Family Medical Leave Act (FMLA)-patient will need paperwork filled out.  Advised that I will keep her out until April 8.  After that, she can go back to work (the less cardiology advises against it) patient know she has driving restrictions until cleared from neurology.  Clarification-nurse visits weekly x 3 more for B12, then monthly.  Repeat CBC, iron studies in 1 months.  Follow-up with me in 3 months for reevaluation  Meds ordered this encounter  Medications   cyanocobalamin (VITAMIN B12) injection 1,000 mcg   metFORMIN (GLUCOPHAGE) 500 MG tablet    Sig: Take 1 tablet (500 mg total) by mouth daily with breakfast.    Dispense:  90 tablet    Refill:  1    Wellington Hampshire, MD

## 2023-03-22 NOTE — Patient Instructions (Addendum)
Vitamin C 1000/day Iron 325mg  daily.   If constipated-colace 100mg  daily.  Take the iron with the C.   B12 weekly for 4 weeks then monthly and re-evaluate in 3 month(s) Vitamin D weekly for 3 months and reevaluate  Wegovy, Zepbound-check insurance-for weight loss.    Start metformin at breakfast for sugars.

## 2023-03-22 NOTE — Patient Instructions (Signed)
Syncope, Adult  Syncope refers to a condition in which a person temporarily loses consciousness. Syncope may also be called fainting or passing out. It is caused by a sudden decrease in blood flow to the brain. This can happen for a variety of reasons. Most causes of syncope are not dangerous. It can be triggered by things such as needle sticks, seeing blood, pain, or intense emotion. However, syncope can also be a sign of a serious medical problem, such as a heart abnormality. Other causes can include dehydration, migraines, or taking medicines that lower blood pressure. Your health care provider may do tests to find the reason why you are having syncope. If you faint, get medical help right away. Call your local emergency services (911 in the [place] Follow these instructions at home: Pay attention to any changes in your symptoms. Take these actions to stay safe and to help relieve your symptoms: Knowing when you may be about to faint Signs that you may be about to faint include: Feeling dizzy, weak, light-headed, or like the room is spinning. Feeling nauseous. Seeing spots or seeing all white or all black in your field of vision. Having cold, clammy skin or feeling warm and sweaty. Hearing ringing in the ears (tinnitus). If you start to feel like you might faint, sit or lie down right away. If sitting, put your head down between your legs. If lying down, raise (elevate) your feet above the level of your heart. Breathe deeply and steadily. Wait until all the symptoms have passed. Have someone stay with you until you feel stable. Medicines Take over-the-counter and prescription medicines only as told by your health care provider. If you are taking blood pressure or heart medicine, get up slowly and take several minutes to sit and then stand. This can reduce dizziness and decrease the risk of syncope. Lifestyle Do not drive, use machinery, or play sports until your health care provider says it is  okay. Do not drink alcohol. Do not use any products that contain nicotine or tobacco. These products include cigarettes, chewing tobacco, and vaping devices, such as e-cigarettes. If you need help quitting, ask your health care provider. Avoid hot tubs and saunas. General instructions Talk with your health care provider about your symptoms. You may need to have testing to understand the cause of your syncope. Drink enough fluid to keep your urine pale yellow. Avoid prolonged standing. If you must stand for a long time, do movements such as: Moving your legs. Crossing your legs. Flexing and stretching your leg muscles. Squatting. Keep all follow-up visits. This is important. Contact a health care provider if: You have episodes of near fainting. Get help right away if: You faint. You hit your head or are injured after fainting. You have any of these symptoms that may indicate trouble with your heart: Fast or irregular heartbeats (palpitations). Unusual pain in your chest, abdomen, or back. Shortness of breath. You have a seizure. You have a severe headache. You are confused. You have vision problems. You have severe weakness or trouble walking. You are bleeding from your mouth or rectum, or you have black or tarry stool. These symptoms may represent a serious problem that is an emergency. Do not wait to see if your symptoms will go away. Get medical help right away. Call your local emergency services (911 in the [place] Do not drive yourself to the hospital. Summary Syncope refers to a condition in which a person temporarily loses consciousness. Syncope may also be called   fainting or passing out. It is caused by a sudden decrease in blood flow to the brain. Signs that you may be about to faint include dizziness, feeling light-headed, feeling nauseous, sudden vision changes, or cold, clammy skin. Even though most causes of syncope are not dangerous, syncope can be a sign of a serious  medical problem. Get help right away if you faint. If you start to feel like you might faint, sit or lie down right away. If sitting, put your head down between your legs. If lying down, raise (elevate) your feet above the level of your heart. This information is not intended to replace advice given to you by your health care provider. Make sure you discuss any questions you have with your health care provider. Document Revised: 04/17/2021 Document Reviewed: 04/17/2021 Elsevier Patient Education  2023 Elsevier Inc.  

## 2023-03-22 NOTE — Progress Notes (Signed)
Pt received b12 in right deltoid, pt tolerated well. 

## 2023-03-22 NOTE — Progress Notes (Signed)
[facility]  Provider:  Dr Bryant Lipps Referring Provider: Davonna Belling, MD Primary Care Physician:  Tawnya Crook, MD  Chief Complaint  Patient presents with   New Patient (Initial Visit)    Patient in room #2 with her husband. Patient here today to discuss her visit to the ER for falls and seizures. Patient states she had a headaches on Thursday and Friday and took paracetamol. Patient states she had some dizziness but she eat something than it went away.    HPI:  Breanna Byrd is a 42 y.o. female and seen here upon referral from Dr. Alvino Chapel for a Consultation/ Evaluation of a spell/ syncopal episode.  ED visit was last Sunday night- March 03-14-2023.   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 She was dehydrated by her own report. EMS brought her to ED and she was described as being confused. There was a security camera at the temple back door which covered the area, showing her to slide to the ground with her arm bracing the fall, obviously having reflexes. I see no convulsions.  She was out of consciousness for 5 minutes? Her friends pushed her on the chest and she started to breathe, and held her hand - this hand was gripping hard.   4-5 months earlier she had an appendectomy, was diagnosed with Vit D and iron deficiency.  This patient reports never having had a spell like this.  Patient is here with her husband.  Patient states she had a headaches on Thursday and Friday and took paracetamol.  Patient states she had a headaches on last Thursday and Friday- and took paracetamol. Patient states she had some lightheadedness,  dizziness but she eat something than it went away.   Maternal aunt with Epilepsy since childhood. Brother had MS, died in [id number] at age 47 years old. .     Review of Systems: Out of a complete 14 system review, the patient complains of only the following symptoms,  and all other reviewed systems are negative.   Social History   Socioeconomic History   Marital status: Married    Spouse name: Not on file   Number of children: 2   Years of education: Not on file   Highest education level: Not on file  Occupational History   Not on file  Tobacco Use   Smoking status: Never   Smokeless tobacco: Never  Vaping Use   Vaping Use: Never used  Substance and Sexual Activity   Alcohol use: Yes    Comment: Occasionally (average 1 pint of beer   per month)   Drug use: Never   Sexual activity: Yes    Partners: Male    Birth control/protection: Condom  Other Topics Concern   Not on file  Social History Narrative   HR dept   Social Determinants of Health   Financial Resource Strain: Not on file  Food Insecurity: No Food Insecurity (03/18/2023)   Hunger Vital Sign    Worried About Running Out of Food in the Last Year: Never true    Ran Out of Food in the Last Year: Never true  Transportation Needs: No Transportation Needs (03/18/2023)   PRAPARE - Hydrologist (Medical): No    Lack of Transportation (Non-Medical): No  Physical Activity: Not on file  Stress: Not on file  Social Connections: Not on file  Intimate Partner Violence:  Not on file    Family History  Problem Relation Age of Onset   Hyperlipidemia Mother    Healthy Mother    Hypertension Father    Diabetes Father    Colon cancer Maternal Grandmother 32   Esophageal cancer Neg Hx    Stomach cancer Neg Hx    Rectal cancer Neg Hx     Past Medical History:  Diagnosis Date   Fatty liver    Hyperlipidemia    Kidney stone     Past Surgical History:  Procedure Laterality Date   COLONOSCOPY  2023   LAPAROSCOPIC APPENDECTOMY N/A 10/08/2022   Procedure: APPENDECTOMY LAPAROSCOPIC;  Surgeon: Stark Klein, MD;  Location: [place];  Service: General;  Laterality: N/A;   UMBILICAL HERNIA REPAIR  10/08/2022   Procedure: HERNIA REPAIR UMBILICAL ADULT;  Surgeon:  Stark Klein, MD;  Location: [place];  Service: General;;    Current Outpatient Medications  Medication Sig Dispense Refill   Vitamin D, Ergocalciferol, (DRISDOL) 1.25 MG (50000 UNIT) CAPS capsule twice weekly for 4 wks, then weekly for 2 more months. 14 capsule 3   No current facility-administered medications for this visit.    Allergies as of 03/22/2023   (No Known Allergies)    Vitals: Ht 5\' 4"  (1.626 m)   Wt 223 lb 3.2 oz (101.2 kg)   LMP 02/27/2023 (Exact Date)   BMI 38.31 kg/m  Last Weight:  Wt Readings from Last 1 Encounters:  03/22/23 223 lb 3.2 oz (101.2 kg)   Last Height:   Ht Readings from Last 1 Encounters:  03/22/23 5\' 4"  (1.626 m)   Last BMI: 38.31  Physical exam:  General: The patient is awake, alert and appears not in acute distress.  The patient is well groomed. Head: Normocephalic, atraumatic.  Neck is supple. Neck circumference:18" Cardiovascular:  Regular rate and palpable peripheral pulse:  Respiratory: clear to auscultation.  Skin:  Without evidence of edema, or rash Trunk: normal posture.   Neurologic exam : The patient is awake and alert, oriented to place and time.  Memory subjective  described as intact.  There is a normal attention span & concentration ability.  Speech is fluent without  dysarthria, dysphonia or aphasia.  Mood and affect are appropriate.  Cranial nerves: Pupils are equal and briskly reactive to light. Funduscopic exam without  evidence of pallor or edema. Extraocular movements  in vertical and horizontal planes intact and without nystagmus. Visual fields by finger perimetry are intact. Hearing to finger rub intact.  Facial sensation intact to fine touch. Facial motor strength is symmetric and tongue and uvula move midline.  Motor exam:   Normal tone and normal muscle bulk and symmetric normal strength in all extremities. Grip Strength was bilaterally reduced,  not maximum effort.  Proximal strength of shoulder muscles  and hip flexors was normal .  Sensory:  Fine touch and vibration were tested .  Proprioception was tested in the upper extremities as normal.  Coordination: Rapid alternating movements in the fingers/hands were intact.   Finger-to-nose maneuver was tested and showed no evidence of ataxia, dysmetria or tremor.  Gait and station: Patient walked without assistive device. Deep tendon reflexes: in the  upper and lower extremities are symmetric and  Attenuated.  Babinski maneuver response is downgoing.   Assessment: Total time for face to face interview and examination, for review of  images and laboratory testing, neurophysiology testing and pre-existing records, including out-of -network , was 40 minutes. Assessment is as follows here:  1)  Lightheadedness, SOB when walking on incline, heaviness in the head and chest .  2)   Syncope with cardiac cause ? Vasovagal ? She was probably dehydrated.  3)   high cholesterol in a vegetarian- needs to speaK to PCP> >.   Plan:  Treatment plan and additional workup planned after today includes:   1)  I will order an EEG- I don't see this as a seizure but a syncope. MRI brain.  2) new SOB- cardiac evaluation, in [place] was 3 years ago. Here only ECG.  Needs a repeat - ask PCP. 3) prediabetes. Will speak to Dr. Cherlynn Kaiser about treatment.   I will ask not to drive until we have the EEG and MRI .    Prenatal vitamins, iron and folic acid, and a chest evaluation, cardiac stress test would be indicated.   Larey Seat, MD

## 2023-03-24 ENCOUNTER — Encounter: Payer: Self-pay | Admitting: Cardiology

## 2023-03-24 ENCOUNTER — Ambulatory Visit: Payer: BC Managed Care – PPO | Admitting: Cardiology

## 2023-03-24 ENCOUNTER — Other Ambulatory Visit: Payer: BC Managed Care – PPO

## 2023-03-24 ENCOUNTER — Telehealth: Payer: Self-pay | Admitting: Neurology

## 2023-03-24 VITALS — BP 98/66 | HR 65 | Resp 16 | Ht 64.0 in | Wt 223.2 lb

## 2023-03-24 DIAGNOSIS — R7303 Prediabetes: Secondary | ICD-10-CM

## 2023-03-24 DIAGNOSIS — E7800 Pure hypercholesterolemia, unspecified: Secondary | ICD-10-CM | POA: Diagnosis not present

## 2023-03-24 DIAGNOSIS — E6609 Other obesity due to excess calories: Secondary | ICD-10-CM

## 2023-03-24 DIAGNOSIS — R55 Syncope and collapse: Secondary | ICD-10-CM | POA: Diagnosis not present

## 2023-03-24 DIAGNOSIS — R0609 Other forms of dyspnea: Secondary | ICD-10-CM | POA: Diagnosis not present

## 2023-03-24 DIAGNOSIS — Z6838 Body mass index (BMI) 38.0-38.9, adult: Secondary | ICD-10-CM

## 2023-03-24 MED ORDER — SEMAGLUTIDE-WEIGHT MANAGEMENT 0.25 MG/0.5ML ~~LOC~~ SOAJ
0.2500 mg | SUBCUTANEOUS | 1 refills | Status: DC
Start: 2023-03-24 — End: 2023-04-09

## 2023-03-24 NOTE — Patient Instructions (Signed)
Vasovagal syncope

## 2023-03-24 NOTE — Progress Notes (Signed)
Primary Physician/Referring:  Tawnya Crook, MD  Patient ID: Breanna Byrd, female    DOB: [birth year], 42 y.o.   MRN: [id number]  Chief Complaint  Patient presents with   Loss of Consciousness   New Patient (Initial Visit)   HPI:    Breanna Byrd  is a 42 y.o. Patient referred to me for evaluation of syncope.  Asian [place] female patient with mixed hyperlipidemia, prediabetes mellitus, referred by Dr. Carmon Sails.  On 03/14/2023, patient had an ED visit.  After she had an episode of syncope.  It appears that patient was in a social meeting and was in the temple and was busy with multiple functions, then she went to the bathroom and she lost consciousness.  EMS was activated, reported to eye movements noted, EMS reports suggest she was feeling lightheaded followed by syncope.  There is no history of seizure disorder, no prior syncope.  Patient evaluated both in the emergency room and by PCP and also was seen by Dr., Dohmeier from neurologic standpoint.  She is accompanied by her husband, who states that she has shortness of breath when she climbs a flight of stairs in the house.  On further questioning, patient has been extremely sedentary over the past several years especially last 2 years.  Denies chest pain, palpitations, prior dizziness or syncope. Past Medical History:  Diagnosis Date   Fatty liver    Hyperlipidemia    Kidney stone    Past Surgical History:  Procedure Laterality Date   COLONOSCOPY  2023   LAPAROSCOPIC APPENDECTOMY N/A 10/08/2022   Procedure: APPENDECTOMY LAPAROSCOPIC;  Surgeon: Stark Klein, MD;  Location: [place];  Service: General;  Laterality: N/A;   UMBILICAL HERNIA REPAIR  10/08/2022   Procedure: HERNIA REPAIR UMBILICAL ADULT;  Surgeon: Stark Klein, MD;  Location: [place];  Service: General;;   Family History  Problem Relation Age of Onset   Hyperlipidemia Mother    Healthy Mother    Hypertension Father    Diabetes Father    Multiple  sclerosis Brother    Colon cancer Maternal Grandmother 92   Esophageal cancer Neg Hx    Stomach cancer Neg Hx    Rectal cancer Neg Hx     Social History   Tobacco Use   Smoking status: Never   Smokeless tobacco: Never  Substance Use Topics   Alcohol use: Yes    Comment: Occasionally (average 1 pint of beer   per month)   Marital Status: Married  ROS  Review of Systems  Cardiovascular:  Positive for dyspnea on exertion. Negative for chest pain and leg swelling.   Objective      03/24/2023    1:25 PM 03/22/2023    4:16 PM 03/22/2023    8:18 AM  Vitals with BMI  Height 5\' 4"  5\' 4"  5\' 4"   Weight 223 lbs 3 oz 223 lbs 223 lbs 3 oz  BMI 38.29 0000000 [id number]  Systolic 98 [id number]   Diastolic 66 65   Pulse 65 76    SpO2: 99 %  Orthostatic VS for the past 72 hrs (Last 3 readings):  Orthostatic BP Patient Position BP Location Cuff Size Orthostatic Pulse  03/24/23 1339 105/66 Standing Left Arm Large 106  03/24/23 1338 110/68 Sitting Left Arm Large 86  03/24/23 1337 115/67 Supine Left Arm Large 83    Physical Exam Neck:     Vascular: No carotid bruit or JVD.  Cardiovascular:     Rate and Rhythm: Normal rate and  regular rhythm.     Pulses: Intact distal pulses.     Heart sounds: Normal heart sounds. No murmur heard.    No gallop.  Pulmonary:     Effort: Pulmonary effort is normal.     Breath sounds: Normal breath sounds.  Abdominal:     General: Bowel sounds are normal.     Palpations: Abdomen is soft.  Musculoskeletal:     Right lower leg: No edema.     Left lower leg: No edema.     Laboratory examination:   Recent Labs    10/02/22 0817 03/12/23 0853 03/14/23 2143  NA 140 140 136  K 4.0 4.2 3.7  CL 106 105 101  CO2 25 27 24   GLUCOSE 103* 95 102*  BUN 6 7 <5*  CREATININE 0.65 0.64 0.71  CALCIUM 9.5 9.1 9.4  GFRNONAA >60  --  >60    Lab Results  Component Value Date   GLUCOSE 102 (H) 03/14/2023   NA 136 03/14/2023   K 3.7 03/14/2023   CL 101 03/14/2023   CO2  24 03/14/2023   BUN <5 (L) 03/14/2023   CREATININE 0.71 03/14/2023   GFRNONAA >60 03/14/2023   CALCIUM 9.4 03/14/2023   PROT 7.2 03/14/2023   ALBUMIN 4.1 03/14/2023   BILITOT 0.5 03/14/2023   ALKPHOS 68 03/14/2023   AST 23 03/14/2023   ALT 24 03/14/2023   ANIONGAP 11 03/14/2023      Lab Results  Component Value Date   ALT 24 03/14/2023   AST 23 03/14/2023   ALKPHOS 68 03/14/2023   BILITOT 0.5 03/14/2023       Latest Ref Rng & Units 03/14/2023    9:43 PM 03/12/2023    8:53 AM 10/02/2022    8:17 AM  Hepatic Function  Total Protein 6.5 - 8.1 g/dL 7.2  6.7  6.6   Albumin 3.5 - 5.0 g/dL 4.1  4.1  3.7   AST 15 - 41 U/L 23  16  20    ALT 0 - 44 U/L 24  20  20    Alk Phosphatase 38 - 126 U/L 68  63  58   Total Bilirubin 0.3 - 1.2 mg/dL 0.5  0.4  0.4     Lipid Panel Recent Labs    03/12/23 0853  CHOL 229*  TRIG 131.0  LDLCALC 148*  VLDL 26.2  HDL 54.60  CHOLHDL 4    HEMOGLOBIN A1C Lab Results  Component Value Date   HGBA1C 6.0 03/12/2023   MPG 114 08/23/2020   TSH Recent Labs    03/12/23 0853  TSH 2.31   Lp(a) 101.2 09/27/2022 Radiology:    Cardiac Studies:  NA  EKG:   EKG 03/24/2023: Normal sinus rhythm at rate of 88 bpm, incomplete right bundle branch block.  No evidence of ischemia.  Normal EKG.  EKG 03/14/2023: Normal sinus rhythm at the rate of 80 bpm, normal EKG.  Medications and allergies  No Known Allergies   Medication list   Current Outpatient Medications:    Semaglutide-Weight Management 0.25 MG/0.5ML SOAJ, Inject 0.25 mg into the skin once a week., Disp: 2 mL, Rfl: 1   Vitamin D, Ergocalciferol, (DRISDOL) 1.25 MG (50000 UNIT) CAPS capsule, twice weekly for 4 wks, then weekly for 2 more months., Disp: 14 capsule, Rfl: 3  Assessment     ICD-10-CM   1. Syncope and collapse  R55 EKG 12-Lead    PCV ECHOCARDIOGRAM COMPLETE    PCV CARDIAC STRESS TEST  LONG TERM MONITOR (3-14 DAYS)    2. Hypercholesteremia  E78.00     3. Prediabetes   R73.03 Semaglutide-Weight Management 0.25 MG/0.5ML SOAJ    4. Dyspnea on exertion  R06.09 PCV ECHOCARDIOGRAM COMPLETE    PCV CARDIAC STRESS TEST    5. Class 2 obesity due to excess calories without serious comorbidity with body mass index (BMI) of 38.0 to 38.9 in adult  E66.09 Semaglutide-Weight Management 0.25 MG/0.5ML SOAJ   Z68.38        Orders Placed This Encounter  Procedures   PCV CARDIAC STRESS TEST    Standing Status:   Future    Standing Expiration Date:   05/24/2023   LONG TERM MONITOR (3-14 DAYS)    Standing Status:   Future    Standing Expiration Date:   03/23/2024    Order Specific Question:   Where should this test be performed?    Answer:   [facility]    Order Specific Question:   Does the patient have an implanted cardiac device?    Answer:   No    Order Specific Question:   Prescribed days of wear    Answer:   22    Order Specific Question:   Type of enrollment    Answer:   Clinic Enrollment    Order Specific Question:   Release to patient    Answer:   Immediate   EKG 12-Lead   PCV ECHOCARDIOGRAM COMPLETE    Standing Status:   Future    Standing Expiration Date:   03/23/2024    Meds ordered this encounter  Medications   Semaglutide-Weight Management 0.25 MG/0.5ML SOAJ    Sig: Inject 0.25 mg into the skin once a week.    Dispense:  2 mL    Refill:  1   Medications Discontinued During This Encounter  Medication Reason   metFORMIN (GLUCOPHAGE) 500 MG tablet Patient Preference     Recommendations:   Breanna Byrd is a 42 y.o.  Patient referred to me for evaluation of syncope.  [place] female patient with mixed hyperlipidemia, prediabetes mellitus, referred by Dr. Carmon Sails.  On 03/14/2023, patient had an ED visit.  After she had an episode of syncope.  It appears that patient was in a social meeting and was in the [place] and was busy with multiple functions, then she walking out of the bathroom and she lost consciousness.  1. Syncope and  collapse I reviewed the video recording of patient collapsing when she walked out of the bathroom, fell down to her left side.  I do not see any seizure-like activity.  Patient did not have any incontinence.  Only concerning thing is that she does not remember or recollect falling down and only is able to remember after she reached ED via EMS.  Although seizure with vasovagal syncope is rare, it can happen as her blood pressure in general has been very soft and my suspicion is that this was probably a vasovagal episode related to hypotension from dehydration as well.  I will perform a routine treadmill exercise stress test and an echocardiogram to exclude structural heart disease and inducible arrhythmias.  I will also perform Zio patch for 2 weeks.  Carotid sinus massage did not elicit any history of bradycardia.  I have reviewed and personally looked at EMS run sheet, reviewed the neurology notes and also PCP notes.  - EKG 12-Lead - PCV ECHOCARDIOGRAM COMPLETE; Future - PCV CARDIAC STRESS TEST; Future - LONG TERM MONITOR (3-14  DAYS); Future  2. Hypercholesteremia Her lipids are elevated.  Being [place] heritage, I probably would go ahead and start her on statin therapy.  Vitamin D is very low, she is being supplemented right now.  When she is done with this, would recommend starting her on atorvastatin 20 mg of Crestor 10 mg, she may need addition of Zetia to get LDL to <100 or probably closer to 70 in view of diabetes/prediabetes and patient [place] heritage.  3. Prediabetes Patient has obesity, also has prediabetes close to being a diabetic, will go ahead and send [facility] Rx and do prior authorization.  If I am unable to get this approved, it may still be worthwhile endeavor from the PCP standpoint to consider getting one of the GLP-1 agents.  - Semaglutide-Weight Management 0.25 MG/0.5ML SOAJ; Inject 0.25 mg into the skin once a week.  Dispense: 2 mL; Refill: 1  4. Dyspnea on exertion Patient  husband states that she has been having shortness of breath when she climbs a flight of stairs.  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  Suspect deconditioning and coronary artery disease as an etiology.  She also complains of sharp chest pain on the left upper part of the chest that is focal lasting a few seconds, suspect muscle spasm and do not suspect angina pectoris.  - PCV ECHOCARDIOGRAM COMPLETE; Future - PCV CARDIAC STRESS TEST; Future  5. Class 2 obesity due to excess calories without serious comorbidity with body mass index (BMI) of 38.0 to 38.9 in adult As dictated above, she has near morbid obesity, weight management would be appropriate.  Could also consider referral to health and wellness.  - Semaglutide-Weight Management 0.25 MG/0.5ML SOAJ; Inject 0.25 mg into the skin once a week.  Dispense: 2 mL; Refill: 1  I will see her back on a as needed basis unless the above tests are abnormal or she has recurrence.     Adrian Prows, MD, [facility] 03/24/2023, 2:43 PM Office: [phone]

## 2023-03-24 NOTE — Telephone Encounter (Signed)
Form placed up front for pick-up. Left detailed message informing patient.

## 2023-03-24 NOTE — Telephone Encounter (Signed)
Pt scheduled for MR brain w/wo contrast at [facility] for 03/30/23 at [place] no auth required

## 2023-03-30 ENCOUNTER — Ambulatory Visit (INDEPENDENT_AMBULATORY_CARE_PROVIDER_SITE_OTHER): Payer: BC Managed Care – PPO

## 2023-03-30 DIAGNOSIS — E538 Deficiency of other specified B group vitamins: Secondary | ICD-10-CM | POA: Diagnosis not present

## 2023-03-30 DIAGNOSIS — R0602 Shortness of breath: Secondary | ICD-10-CM

## 2023-03-30 DIAGNOSIS — R55 Syncope and collapse: Secondary | ICD-10-CM

## 2023-03-30 DIAGNOSIS — D508 Other iron deficiency anemias: Secondary | ICD-10-CM | POA: Diagnosis not present

## 2023-03-30 DIAGNOSIS — R7303 Prediabetes: Secondary | ICD-10-CM

## 2023-03-30 DIAGNOSIS — R0789 Other chest pain: Secondary | ICD-10-CM

## 2023-03-30 MED ORDER — CYANOCOBALAMIN 1000 MCG/ML IJ SOLN
1000.0000 ug | Freq: Once | INTRAMUSCULAR | Status: AC
Start: 2023-03-30 — End: 2023-03-30
  Administered 2023-03-30: 1000 ug via INTRAMUSCULAR

## 2023-03-30 MED ORDER — GADOBENATE DIMEGLUMINE 529 MG/ML IV SOLN
20.0000 mL | Freq: Once | INTRAVENOUS | Status: AC | PRN
Start: 2023-03-30 — End: 2023-03-30
  Administered 2023-03-30: 20 mL via INTRAVENOUS

## 2023-03-30 NOTE — Progress Notes (Signed)
Breanna Byrd 42 yr old female presents to office today for 2nd of 4 weekly B12 injections per Lutricia Horsfall, MD. Administered CYANOCOBALAMIN 1,000 mcg IM right arm. Patient tolerated well.

## 2023-03-31 ENCOUNTER — Encounter: Payer: Self-pay | Admitting: Neurology

## 2023-04-01 NOTE — Telephone Encounter (Signed)
Per the [employer] benefits website, this patient's plan does not require prior authorization for MRI. I tried to call the number on the back of her card to verify twice and both times it gave me a busy signal and dropped my call.

## 2023-04-05 ENCOUNTER — Ambulatory Visit (INDEPENDENT_AMBULATORY_CARE_PROVIDER_SITE_OTHER): Payer: BC Managed Care – PPO | Admitting: Neurology

## 2023-04-05 DIAGNOSIS — R0789 Other chest pain: Secondary | ICD-10-CM

## 2023-04-05 DIAGNOSIS — R55 Syncope and collapse: Secondary | ICD-10-CM

## 2023-04-05 DIAGNOSIS — R0602 Shortness of breath: Secondary | ICD-10-CM

## 2023-04-05 DIAGNOSIS — D508 Other iron deficiency anemias: Secondary | ICD-10-CM

## 2023-04-06 ENCOUNTER — Ambulatory Visit (INDEPENDENT_AMBULATORY_CARE_PROVIDER_SITE_OTHER): Payer: BC Managed Care – PPO

## 2023-04-06 DIAGNOSIS — E538 Deficiency of other specified B group vitamins: Secondary | ICD-10-CM | POA: Diagnosis not present

## 2023-04-06 MED ORDER — CYANOCOBALAMIN 1000 MCG/ML IJ SOLN
1000.0000 ug | Freq: Once | INTRAMUSCULAR | Status: AC
Start: 2023-04-06 — End: 2023-04-06
  Administered 2023-04-06: 1000 ug via INTRAMUSCULAR

## 2023-04-06 NOTE — Progress Notes (Signed)
Breanna Byrd 42 yr old female  presents to office today for monthly B12 injection per Lutricia Horsfall, MD. Administered CYANOCOBALAMIN 1,000 mcg IM right arm. Patient tolerated well.

## 2023-04-09 ENCOUNTER — Encounter: Payer: Self-pay | Admitting: Cardiology

## 2023-04-09 NOTE — Telephone Encounter (Signed)
From patient.

## 2023-04-09 NOTE — Procedures (Signed)
[facility]  EEG (ELECTROENCEPHALOGRAM) REPORT     ORDERING CLINICIAN: Melvyn Novas, M.D.  TECHNOLOGIST: Marianne Sofia, REEGT TECHNIQUE:  This Electroencephalogram was recorded utilizing the international standard 10-20 system of lead placement and reformatted into average and bipolar montages.  A single ECG electrode is placed to detect heart rate and rhythm. While recording the EEG ,the patient is observed by the attending technologist. STUDY DATE:  04-05-2023 PATIENT NAME: Breanna Byrd  Recoding duration :25:08  Activation included:  yes :Photic stimulation yes; Hyperventilation .      Description: The EEG's posterior dominant background rhythm of 10 hertz was symmetrically displayed while the patient's eyes were closed , and promptly attenuated with eye opening. At baseline, the recording showed a moderate amplitude in symmetric fashion. THE single channel ECG reflected a regular heart rhythm of 54 bpm.  Photic stimulation was initiated at frequencies from 3- through 21 hertz, resulting in photic entrainment upon all frequencies at and below 13 hz without periodic or rhythmic discharges, or epileptiform activity.  Hyperventilation maneuver was initiated, and the patient provided good effort- reflected in  EEG amplitude build-up, but not leading to slowing.  Following hyperventilation the patient's EEG was reviewed for a period of 1 and 2 minutes post maneuver, and indicated drowsiness and light sleep manifesting as vertex sharp waves. The patient was recorded while awake, while drowsy and briefly asleep.   IMPRESSION:  This EEG is a normal study.     Dr. Melvyn Novas, M.D. Accredited by the ABPN, ABSM.

## 2023-04-13 ENCOUNTER — Ambulatory Visit (INDEPENDENT_AMBULATORY_CARE_PROVIDER_SITE_OTHER): Payer: BC Managed Care – PPO

## 2023-04-13 DIAGNOSIS — E538 Deficiency of other specified B group vitamins: Secondary | ICD-10-CM

## 2023-04-13 MED ORDER — CYANOCOBALAMIN 1000 MCG/ML IJ SOLN
1000.0000 ug | Freq: Once | INTRAMUSCULAR | Status: AC
Start: 2023-04-13 — End: 2023-04-13
  Administered 2023-04-13: 1000 ug via INTRAMUSCULAR

## 2023-04-13 NOTE — Progress Notes (Signed)
Breanna Byrd 42 yr old female  presents to office today for 3rd of 4 weekly B12 injection per Lutricia Horsfall, MD. Administered CYANOCOBALAMIN 1,000 mcg IM right arm. Patient tolerated well

## 2023-04-15 NOTE — Telephone Encounter (Signed)
From patient.

## 2023-04-19 ENCOUNTER — Other Ambulatory Visit (INDEPENDENT_AMBULATORY_CARE_PROVIDER_SITE_OTHER): Payer: BC Managed Care – PPO

## 2023-04-19 DIAGNOSIS — D508 Other iron deficiency anemias: Secondary | ICD-10-CM | POA: Diagnosis not present

## 2023-04-19 LAB — CBC WITH DIFFERENTIAL/PLATELET
Basophils Absolute: 0 10*3/uL (ref 0.0–0.1)
Basophils Relative: 0.3 % (ref 0.0–3.0)
Eosinophils Absolute: 0.1 10*3/uL (ref 0.0–0.7)
Eosinophils Relative: 2.1 % (ref 0.0–5.0)
HCT: 37.5 % (ref 36.0–46.0)
Hemoglobin: 12.3 g/dL (ref 12.0–15.0)
Lymphocytes Relative: 30.3 % (ref 12.0–46.0)
Lymphs Abs: 1.7 10*3/uL (ref 0.7–4.0)
MCHC: 32.9 g/dL (ref 30.0–36.0)
MCV: 78 fl (ref 78.0–100.0)
Monocytes Absolute: 0.5 10*3/uL (ref 0.1–1.0)
Monocytes Relative: 8.2 % (ref 3.0–12.0)
Neutro Abs: 3.3 10*3/uL (ref 1.4–7.7)
Neutrophils Relative %: 59.1 % (ref 43.0–77.0)
Platelets: 347 10*3/uL (ref 150.0–400.0)
RBC: 4.81 Mil/uL (ref 3.87–5.11)
RDW: 22.4 % — ABNORMAL HIGH (ref 11.5–15.5)
WBC: 5.6 10*3/uL (ref 4.0–10.5)

## 2023-04-19 LAB — IBC + FERRITIN
Ferritin: 7.6 ng/mL — ABNORMAL LOW (ref 10.0–291.0)
Iron: 28 ug/dL — ABNORMAL LOW (ref 42–145)
Saturation Ratios: 6.3 % — ABNORMAL LOW (ref 20.0–50.0)
TIBC: 448 ug/dL (ref 250.0–450.0)
Transferrin: 320 mg/dL (ref 212.0–360.0)

## 2023-04-19 NOTE — Progress Notes (Signed)
Hemoglobin has improved, however, iron is still very low.  Verify that she is taking the iron supplements daily.  If so, increase to twice daily dosing.  She may need to take a stool softener to prevent constipation.

## 2023-04-21 ENCOUNTER — Ambulatory Visit: Payer: BC Managed Care – PPO

## 2023-04-21 DIAGNOSIS — R55 Syncope and collapse: Secondary | ICD-10-CM | POA: Diagnosis not present

## 2023-04-21 DIAGNOSIS — R0609 Other forms of dyspnea: Secondary | ICD-10-CM

## 2023-04-21 NOTE — Telephone Encounter (Signed)
From patient.

## 2023-04-23 ENCOUNTER — Encounter: Payer: Self-pay | Admitting: Family Medicine

## 2023-04-26 ENCOUNTER — Ambulatory Visit: Payer: BC Managed Care – PPO

## 2023-04-26 DIAGNOSIS — R0609 Other forms of dyspnea: Secondary | ICD-10-CM | POA: Diagnosis not present

## 2023-04-26 DIAGNOSIS — R55 Syncope and collapse: Secondary | ICD-10-CM | POA: Diagnosis not present

## 2023-05-22 DIAGNOSIS — R102 Pelvic and perineal pain: Secondary | ICD-10-CM

## 2023-05-22 DIAGNOSIS — N83209 Unspecified ovarian cyst, unspecified side: Secondary | ICD-10-CM

## 2023-05-22 HISTORY — DX: Pelvic and perineal pain: R10.2

## 2023-05-22 HISTORY — DX: Unspecified ovarian cyst, unspecified side: N83.209

## 2023-05-25 ENCOUNTER — Ambulatory Visit: Payer: BC Managed Care – PPO | Admitting: Skilled Nursing Facility1

## 2023-05-27 ENCOUNTER — Ambulatory Visit (INDEPENDENT_AMBULATORY_CARE_PROVIDER_SITE_OTHER): Payer: BC Managed Care – PPO

## 2023-05-27 ENCOUNTER — Encounter (HOSPITAL_COMMUNITY): Payer: Self-pay

## 2023-05-27 ENCOUNTER — Ambulatory Visit (HOSPITAL_COMMUNITY)
Admission: EM | Admit: 2023-05-27 | Discharge: 2023-05-27 | Disposition: A | Discharge status: Home / Self Care | Payer: BC Managed Care – PPO | Attending: Internal Medicine | Admitting: Internal Medicine

## 2023-05-27 DIAGNOSIS — R319 Hematuria, unspecified: Secondary | ICD-10-CM

## 2023-05-27 DIAGNOSIS — R109 Unspecified abdominal pain: Secondary | ICD-10-CM | POA: Diagnosis not present

## 2023-05-27 LAB — POCT URINALYSIS DIP (MANUAL ENTRY)
Bilirubin, UA: NEGATIVE
Glucose, UA: NEGATIVE mg/dL
Ketones, POC UA: NEGATIVE mg/dL
Nitrite, UA: NEGATIVE
Protein Ur, POC: NEGATIVE mg/dL
Spec Grav, UA: 1.01 (ref 1.010–1.025)
Urobilinogen, UA: 0.2 E.U./dL
pH, UA: 7 (ref 5.0–8.0)

## 2023-05-27 LAB — POCT URINE PREGNANCY: Preg Test, Ur: NEGATIVE

## 2023-05-27 MED ORDER — NAPROXEN SODIUM 550 MG PO TABS: 550.0000 mg | ORAL_TABLET | Freq: Two times a day (BID) | ORAL | 0 refills | Status: AC

## 2023-05-27 NOTE — Discharge Instructions (Addendum)
Your urinalysis indicates blood in your urine.  Your KUB is negative for any stones.  Please follow-up for scan that you have scheduled on Monday.  This will help to exclude any smaller stones that may not be visible on the x-ray.The recommendation plenty of water at least 2 L. You have been prescribed naproxen 550 mg twice a day for pain.

## 2023-05-27 NOTE — ED Provider Notes (Signed)
[facility]    CSN: [id number] Arrival date & time: 05/27/23  1504      History   Chief Complaint Chief Complaint  Patient presents with   Flank Pain    HPI Breanna Byrd is a 42 y.o. female.   HPI  She is in today for evaluation of left-sided flank pain.  She reports that she has a history of fibroids so she was unsure if it was related to that.  She reports that today she can confirm that it is more a kidney stone versus a fibroid pain.  She reports that she has had kidney stones in the past but is not sure what type of stone she has.  She denies visible hematuria.  She has taken ibuprofen for the pain which has been effective.  She denies any fever, chills, nausea, vomiting, abdominal pain or pressure.  She reports that she has not ultrasound or pain on Monday.  However the pain was so severe today that she needs to be in significant. Past Medical History:  Diagnosis Date   Fatty liver    Hyperlipidemia    Kidney stone     Patient Active Problem List   Diagnosis Date Noted   Syncope 03/22/2023   Sensation of chest pressure 03/22/2023   SOB (shortness of breath) on exertion 03/22/2023   Iron deficiency anemia secondary to inadequate dietary iron intake 03/22/2023   Dehydration after exertion 03/22/2023   Prediabetes 09/01/2022   ASCUS of cervix with negative high risk HPV 11/29/2020   Hyperlipidemia, mixed 08/23/2020   B12 deficiency 08/23/2020   Vitamin D deficiency 08/23/2020    Past Surgical History:  Procedure Laterality Date   COLONOSCOPY  2023   LAPAROSCOPIC APPENDECTOMY N/A 10/08/2022   Procedure: APPENDECTOMY LAPAROSCOPIC;  Surgeon: Almond Lint, MD;  Location: [facility] OR;  Service: General;  Laterality: N/A;   UMBILICAL HERNIA REPAIR  10/08/2022   Procedure: HERNIA REPAIR UMBILICAL ADULT;  Surgeon: Almond Lint, MD;  Location: [facility] OR;  Service: General;;    OB History   No obstetric history on file.      Home Medications    Prior to  Admission medications   Medication Sig Start Date End Date Taking? Authorizing Provider  naproxen sodium (ANAPROX DS) 550 MG tablet Take 1 tablet (550 mg total) by mouth 2 (two) times daily with a meal for 10 days. 05/27/23 06/06/23 Yes Barbette Merino, NP  Vitamin D, Ergocalciferol, (DRISDOL) 1.25 MG (50000 UNIT) CAPS capsule twice weekly for 4 wks, then weekly for 2 more months. 03/16/23   Jeani Sow, MD    Family History Family History  Problem Relation Age of Onset   Hyperlipidemia Mother    Healthy Mother    Hypertension Father    Diabetes Father    Multiple sclerosis Brother    Colon cancer Maternal Grandmother 32   Esophageal cancer Neg Hx    Stomach cancer Neg Hx    Rectal cancer Neg Hx     Social History Social History   Tobacco Use   Smoking status: Never   Smokeless tobacco: Never  Vaping Use   Vaping Use: Never used  Substance Use Topics   Alcohol use: Yes    Comment: Occasionally (average 1 pint of beer   per month)   Drug use: Never     Allergies   Patient has no known allergies.   Review of Systems Review of Systems   Physical Exam Triage Vital Signs ED Triage Vitals  Enc Vitals Group     BP 05/27/23 1606 (!) 145/73     Pulse Rate 05/27/23 1606 99     Resp 05/27/23 1606 18     Temp 05/27/23 1606 97.7 F (36.5 C)     Temp Source 05/27/23 1606 Oral     SpO2 05/27/23 1606 99 %     Weight --      Height --      Head Circumference --      Peak Flow --      Pain Score 05/27/23 1609 1     Pain Loc --      Pain Edu? --      Excl. in GC? --    No data found.  Updated Vital Signs BP (!) 145/73 (BP Location: Left Arm)   Pulse 99   Temp 97.7 F (36.5 C) (Oral)   Resp 18   LMP 05/15/2023   SpO2 99%   Visual Acuity Right Eye Distance:   Left Eye Distance:   Bilateral Distance:    Right Eye Near:   Left Eye Near:    Bilateral Near:     Physical Exam Constitutional:      Appearance: She is obese.  HENT:     Head: Normocephalic.   Cardiovascular:     Rate and Rhythm: Normal rate.  Pulmonary:     Effort: Pulmonary effort is normal.  Musculoskeletal:        General: Normal range of motion.  Skin:    General: Skin is warm and dry.  Neurological:     General: No focal deficit present.     Mental Status: She is alert.  Psychiatric:        Mood and Affect: Mood normal.      UC Treatments / Results  Labs (all labs ordered are listed, but only abnormal results are displayed) Labs Reviewed  POCT URINALYSIS DIP (MANUAL ENTRY) - Abnormal; Notable for the following components:      Result Value   Blood, UA moderate (*)    Leukocytes, UA Small (1+) (*)    All other components within normal limits  POCT URINE PREGNANCY - Normal    EKG patient   Radiology DG Abdomen 1 View  Result Date: 05/27/2023 CLINICAL DATA:  Hematuria. Left flank pain for 1 hour. History of kidney stones. EXAM: ABDOMEN - 1 VIEW COMPARISON:  None Available. FINDINGS: Gas and stool throughout the colon. No small or large bowel distention. No radiopaque stones are identified. Mild degenerative changes in the spine and hips. Soft tissue contours appear intact. IMPRESSION: Normal nonobstructive bowel gas pattern. No radiopaque stones identified. Electronically Signed   By: Burman Nieves M.D.   On: 05/27/2023 18:13    Procedures Procedures (including critical care time)  Medications Ordered in UC Medications - No data to display  Initial Impression / Assessment and Plan / UC Course  I have reviewed the triage vital signs and the nursing notes.  Pertinent labs & imaging results that were available during my care of the patient were reviewed by me and considered in my medical decision making (see chart for details).     Flank pain Final Clinical Impressions(s) / UC Diagnoses   Final diagnoses:  Flank pain  Hematuria, unspecified type     Discharge Instructions      Your urinalysis indicates blood in your urine.  Your KUB is  negative for any stones.  Please follow-up for scan that you have scheduled on Monday.  This will help to exclude any smaller stones that may not be visible on the x-ray.The recommendation plenty of water at least 2 L. You have been prescribed naproxen 550 mg twice a day for pain.       ED Prescriptions     Medication Sig Dispense Auth. Provider   naproxen sodium (ANAPROX DS) 550 MG tablet Take 1 tablet (550 mg total) by mouth 2 (two) times daily with a meal for 10 days. 20 tablet Barbette Merino, NP      PDMP not reviewed this encounter.   Thad Ranger [place], [place] 05/27/23 [phone]

## 2023-05-27 NOTE — ED Triage Notes (Signed)
Pt c/o lt flank pain x1 hr. States was having rt flank and lower abdomen pain for past week. Hx of kidney stones and this feels the same.

## 2023-05-28 DIAGNOSIS — N83201 Unspecified ovarian cyst, right side: Secondary | ICD-10-CM | POA: Diagnosis not present

## 2023-05-31 ENCOUNTER — Other Ambulatory Visit: Payer: Self-pay | Admitting: Obstetrics & Gynecology

## 2023-06-01 ENCOUNTER — Encounter (HOSPITAL_BASED_OUTPATIENT_CLINIC_OR_DEPARTMENT_OTHER): Payer: Self-pay | Admitting: Obstetrics & Gynecology

## 2023-06-01 NOTE — Progress Notes (Signed)
Spoke w/ via phone for pre-op interview--- pt Lab needs dos----   cbc, bmp, t&s, urine preg            Lab results------ current EKG in epic/ chart COVID test -----patient states asymptomatic no test needed Arrive at ------- 0530 on 06-07-2023 NPO after MN NO Solid Food.  Clear liquids from MN until--- 0430 Med rec completed Medications to take morning of surgery ----- none Diabetic medication ----- do not take metformin morning of surgery Patient instructed no nail polish to be worn day of surgery Patient instructed to bring photo id and insurance card day of surgery Patient aware to have Driver (ride ) / caregiver    for 24 hours after surgery -- husband, Wit[place] Patient Special Instructions ----- n/a Pre-Op special Instructions ----- n/a Patient verbalized understanding of instructions that were given at this phone interview. Patient denies shortness of breath, chest pain, fever, cough at this phone interview.

## 2023-06-04 ENCOUNTER — Encounter: Payer: Self-pay | Admitting: Skilled Nursing Facility1

## 2023-06-04 ENCOUNTER — Encounter: Payer: BC Managed Care – PPO | Attending: Family Medicine | Admitting: Skilled Nursing Facility1

## 2023-06-04 VITALS — Ht 64.0 in | Wt 218.8 lb

## 2023-06-04 DIAGNOSIS — R7303 Prediabetes: Secondary | ICD-10-CM | POA: Diagnosis not present

## 2023-06-04 NOTE — Progress Notes (Signed)
Medical Nutrition Therapy    Primary concerns today: weight loss  Referral diagnosis: r73.03   NUTRITION ASSESSMENT    Clinical Medical Hx: prediabetes, B12 injections, Vitamin D deficiency, iron deficiency, kidney stones Medications: metformin Labs: A1C 6.0, cholestenol 229, hemoglobin 10.6, HCT 33.8, vitamin B12 149, vitamin 7.56, LDL cholesterol 148 Notable Signs/Symptoms:   Lifestyle & Dietary Hx  Pt stet she wants to lose weight and does not wish to follow a strict diet. Pt states now that she has prediabetes she wants to be healthier and lose weight. Pt states she was prescribed wegovy but needs to be on a monitored diet for 3 months first.  Pt states she will be having a ovarian cyst removed upcoming.   Pt states she does eat eggs and drinks milk.  Pt states she has been really tired.   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    Body Composition Scale 06/04/2023  Current Body Weight 218.8  Total Body Fat % 41.6  Visceral Fat 12  Fat-Free Mass % 58.3   Total Body Water % 43.6  Muscle-Mass lbs 31.7  BMI 37.4  Body Fat Displacement          Torso  lbs 56.4         Left Leg  lbs 11.2         Right Leg  lbs 11.2         Left Arm  lbs 5.6         Right Arm   lbs 5.6     Estimated daily fluid intake:  oz Supplements: vitamin D, iron, B12 Sleep:  Stress / self-care: sometimes feeling overwhelmed  Current average weekly physical activity: ADL's  24-Hr Dietary Recall First Meal: coffee + stevia + milk + sugar and walnuts + almonds + dates + almond milk + berries + banana or avocado toast or rice and lentil roti + curry or onion tomato corriander Snack: candies Second Meal 12: brown rice or quinoa or fast food or rice  Snack: chips and salsa or grilled vegetables Third Meal 5:30-6pm: brown rice + curry + vegetables Snack: 1 slice whole  wheat bread + avacado Beverages: 1-2 beer, 2 ounces vodka margarita,    NUTRITION INTERVENTION  Nutrition education (E-1) on the following topics:  Iron Deficiency Anemia: Heme iron: Found in animal sources such as red meat, poultry, and fish. Heme iron is more easily absorbed by the body. Non-heme iron: Present in plant-based sources such as lentils, beans, tofu, fortified cereals, and dark leafy greens. Pairing non-heme iron sources with vitamin C-rich foods can enhance absorption. Iron Supplements: Oral iron supplements: These are commonly prescribed to increase iron levels. They come in different forms, such as ferrous sulfate, ferrous gluconate, and ferrous fumarate. Take as directed: It's essential to take iron supplements as prescribed by a healthcare professional. Taking them with vitamin C can enhance absorption. Regular Monitoring: Blood tests: Periodically monitor iron levels through blood tests to assess progress and adjust supplementation if necessary. Address Underlying Causes: Identify and treat the cause: Determine and address the underlying cause of iron deficiency, which could include dietary factors, malabsorption issues, or chronic bleeding. Manage gastrointestinal issues: For individuals with conditions affecting iron absorption (such as celiac disease or inflammatory bowel disease), managing the underlying condition is crucial. Consider Intravenous (IV) Iron: (prescribed by your  doctor) In cases of severe iron deficiency or intolerance to oral supplements, healthcare professionals may administer iron intravenously. Balanced Diet: Ensure a well-balanced diet that includes a variety of nutrients necessary for overall health   Learning Style & Readiness for Change Teaching method utilized: Visual & Auditory  Demonstrated degree of understanding via: Teach Back  Barriers to learning/adherence to lifestyle change: unidentified   Goals Established by Pt Go swimming on the  weekends Try seaweed Drink coffee/tea 1+ hour before eating meals or taking your iron supplement  Do not have dairy within 1 hour of your iron supplement  Reduce your portions sizes: 1/2 cup cooked rice, lentil, quinoa Pay attention to why you eat the candy at work in order to reduce it, most likely from boredom  [place] with cast iron Get the iron fish to cook with Have a snack in between lunch and dinner such as greek yogurt or 1/4 cup nuts with half banana   MONITORING & EVALUATION Dietary intake, weekly physical activity  Next Steps  Patient is to follow up in 6-8 weeks.

## 2023-06-05 NOTE — H&P (Addendum)
Breanna Byrd is an 42 y.o. female here for laparoscopy for large right ovarian cyst with pain.  Pt reports pain for few weeks but went to ER for worsening pain on   She denies PCOS, denies skipping menses. Denies h/o ovarian cysts in past. She came for office  for ED f/up.  Office sono 05/28/23 uterus 9.9 x 7 x 5.4 cm,EMS 9.5 mm,single fibroid noted, 3.6 x 2.9 x 2.6 cm intramural, also possible adenomyosis.  Enlarged right ovary 7.8 x 7.6 x 6.3 cm with a simple cyst 7.2 x 6.5 x 6.4 cm noted, possible trace internal echoes, no increased blood flow, left ovary normal, no free fluid.  Pain was severe but bit better at office f/up. No nausea/ vomiting. Pt does not desire to have more kids and tubal sterilization was discussed and agrees.   Pt had appendicectomy in Oct'23 for a mucocele with umbilical hernia repair (no mesh) - op note in Epic   Patient's last menstrual period was 05/15/2023.    Past Medical History:  Diagnosis Date   B12 deficiency    Cyst of ovary 05/2023   large   Dyspnea    Fatty liver    History of syncope 03/14/2023   ED visit in epic;  neurology evalution by dr dohmeier in epic 03-22-2023 w/ normal EEG;   cardiology evaluation by dr Jacinto Halim 04-20-2023  w/ normal ETT, echo, and event monitor   Hyperlipidemia, mixed    IDA (iron deficiency anemia)    Nephrolithiasis 2022   per imaging in epic bilateral nonobstructive  (06-01-2023  that she know's of they have not passed)   Pelvic pain in female 05/2023   from large ovarian cyst   Pre-diabetes    Vitamin D deficiency    Wears contact lenses     Past Surgical History:  Procedure Laterality Date   COLONOSCOPY W/ POLYPECTOMY  08/26/2022   dr Barron Alvine   LAPAROSCOPIC APPENDECTOMY N/A 10/08/2022   Procedure: APPENDECTOMY LAPAROSCOPIC;  Surgeon: Almond Lint, MD;  Location: [facility];  Service: General;  Laterality: N/A;   UMBILICAL HERNIA REPAIR  10/08/2022   Procedure: HERNIA REPAIR UMBILICAL ADULT;  Surgeon: Almond Lint, MD;  Location: [facility];  Service: General;;    Family History  Problem Relation Age of Onset   Hyperlipidemia Mother    Healthy Mother    Hypertension Father    Diabetes Father    Multiple sclerosis Brother    Colon cancer Maternal Grandmother 84   Esophageal cancer Neg Hx    Stomach cancer Neg Hx    Rectal cancer Neg Hx     Social History:  reports that she has never smoked. She has never used smokeless tobacco. She reports current alcohol use. She reports that she does not use drugs.  Allergies: No Known Allergies  No medications prior to admission.    Review of Systems  Constitutional:  Negative for activity change.    Height 5\' 4"  (1.626 m), weight 99.3 kg, last menstrual period 05/15/2023. Physical Exam Ht 5\' 4"  (1.626 m)   Wt 99.3 kg   LMP 05/15/2023   BMI 37.59 kg/m   A&O x 3, no acute distress. Pleasant HEENT neg, no thyromegaly Lungs CTA bilat CV RRR, S1S2 normal Abdo soft, non tender, non acute Extr no edema/ tenderness Pelvic uterus to left, right adnexal fullness and tenderness, I can palpate heavy cyst   Assessment/Plan: 42 yo with symptomatic 7-8 cm right ovarian cyst . Cyst is simple. CA125 normal. Here  for Laparoscopic right ovarian cystectomy, possible right oophorectomy, bilateral salpingectomy.  Risks/complications of surgery reviewed incl infection, bleeding, damage to internal organs including bladder, bowels, ureters, blood vessels, other risks from anesthesia, VTE and delayed complications of any surgery, complications in future surgery reviewed.   Robley Fries MD [facility]

## 2023-06-06 NOTE — Anesthesia Preprocedure Evaluation (Signed)
Anesthesia Evaluation  Patient identified by MRN, date of birth, ID band Patient awake    Reviewed: Allergy & Precautions, NPO status , Patient's Chart, lab work & pertinent test results  History of Anesthesia Complications Negative for: history of anesthetic complications  Airway Mallampati: III  TM Distance: >3 FB Neck ROM: Full    Dental  (+) Dental Advisory Given, Teeth Intact   Pulmonary neg pulmonary ROS   Pulmonary exam normal        Cardiovascular negative cardio ROS Normal cardiovascular exam     Neuro/Psych negative neurological ROS  negative psych ROS   GI/Hepatic negative GI ROS, Neg liver ROS,,,  Endo/Other   Pre-DM Obesity  Renal/GU negative Renal ROS     Musculoskeletal negative musculoskeletal ROS (+)    Abdominal   Peds  Hematology negative hematology ROS (+)   Anesthesia Other Findings   Reproductive/Obstetrics                             Anesthesia Physical Anesthesia Plan  ASA: 2  Anesthesia Plan: General   Post-op Pain Management: Tylenol PO (pre-op)* and Celebrex PO (pre-op)*   Induction: Intravenous  PONV Risk Score and Plan: 4 or greater and Treatment may vary due to age or medical condition, Ondansetron, Dexamethasone, Midazolam and Scopolamine patch - Pre-op  Airway Management Planned: Oral ETT  Additional Equipment: None  Intra-op Plan:   Post-operative Plan: Extubation in OR  Informed Consent: I have reviewed the patients History and Physical, chart, labs and discussed the procedure including the risks, benefits and alternatives for the proposed anesthesia with the patient or authorized representative who has indicated his/her understanding and acceptance.     Dental advisory given  Plan Discussed with: CRNA and Anesthesiologist  Anesthesia Plan Comments:        Anesthesia Quick Evaluation

## 2023-06-07 ENCOUNTER — Encounter (HOSPITAL_BASED_OUTPATIENT_CLINIC_OR_DEPARTMENT_OTHER): Payer: Self-pay | Admitting: Obstetrics & Gynecology

## 2023-06-07 ENCOUNTER — Ambulatory Visit (HOSPITAL_BASED_OUTPATIENT_CLINIC_OR_DEPARTMENT_OTHER): Payer: BC Managed Care – PPO | Admitting: Anesthesiology

## 2023-06-07 ENCOUNTER — Encounter (HOSPITAL_BASED_OUTPATIENT_CLINIC_OR_DEPARTMENT_OTHER)
Admission: RE | Disposition: A | Discharge status: Home / Self Care | Payer: Self-pay | Source: Home / Self Care | Attending: Obstetrics & Gynecology

## 2023-06-07 ENCOUNTER — Other Ambulatory Visit: Payer: Self-pay

## 2023-06-07 ENCOUNTER — Ambulatory Visit (HOSPITAL_BASED_OUTPATIENT_CLINIC_OR_DEPARTMENT_OTHER)
Admission: RE | Admit: 2023-06-07 | Discharge: 2023-06-07 | Disposition: A | Discharge status: Home / Self Care | Payer: BC Managed Care – PPO | Attending: Obstetrics & Gynecology | Admitting: Obstetrics & Gynecology

## 2023-06-07 DIAGNOSIS — N809 Endometriosis, unspecified: Secondary | ICD-10-CM | POA: Diagnosis not present

## 2023-06-07 DIAGNOSIS — E669 Obesity, unspecified: Secondary | ICD-10-CM | POA: Insufficient documentation

## 2023-06-07 DIAGNOSIS — N80109 Endometriosis of ovary, unspecified side, unspecified depth: Secondary | ICD-10-CM | POA: Diagnosis present

## 2023-06-07 DIAGNOSIS — R102 Pelvic and perineal pain: Secondary | ICD-10-CM | POA: Diagnosis not present

## 2023-06-07 DIAGNOSIS — N80101 Endometriosis of right ovary, unspecified depth: Secondary | ICD-10-CM | POA: Insufficient documentation

## 2023-06-07 DIAGNOSIS — K760 Fatty (change of) liver, not elsewhere classified: Secondary | ICD-10-CM | POA: Diagnosis not present

## 2023-06-07 DIAGNOSIS — R896 Abnormal cytological findings in specimens from other organs, systems and tissues: Secondary | ICD-10-CM | POA: Diagnosis not present

## 2023-06-07 DIAGNOSIS — Z6837 Body mass index (BMI) 37.0-37.9, adult: Secondary | ICD-10-CM | POA: Insufficient documentation

## 2023-06-07 DIAGNOSIS — N83201 Unspecified ovarian cyst, right side: Secondary | ICD-10-CM | POA: Diagnosis present

## 2023-06-07 DIAGNOSIS — R7303 Prediabetes: Secondary | ICD-10-CM | POA: Insufficient documentation

## 2023-06-07 DIAGNOSIS — E782 Mixed hyperlipidemia: Secondary | ICD-10-CM | POA: Insufficient documentation

## 2023-06-07 DIAGNOSIS — Z01818 Encounter for other preprocedural examination: Secondary | ICD-10-CM

## 2023-06-07 HISTORY — DX: Unspecified ovarian cyst, right side: N83.201

## 2023-06-07 HISTORY — PX: LAPAROSCOPIC BILATERAL SALPINGO OOPHERECTOMY: SHX5890

## 2023-06-07 HISTORY — DX: Dyspnea, unspecified: R06.00

## 2023-06-07 HISTORY — PX: LAPAROSCOPIC OVARIAN CYSTECTOMY: SHX6248

## 2023-06-07 HISTORY — DX: Prediabetes: R73.03

## 2023-06-07 HISTORY — DX: Deficiency of other specified B group vitamins: E53.8

## 2023-06-07 HISTORY — DX: Vitamin D deficiency, unspecified: E55.9

## 2023-06-07 HISTORY — DX: Iron deficiency anemia, unspecified: D50.9

## 2023-06-07 HISTORY — DX: Presence of spectacles and contact lenses: Z97.3

## 2023-06-07 HISTORY — DX: Mixed hyperlipidemia: E78.2

## 2023-06-07 LAB — TYPE AND SCREEN
ABO/RH(D): O POS
Antibody Screen: NEGATIVE

## 2023-06-07 LAB — CBC
HCT: 38.7 % (ref 36.0–46.0)
Hemoglobin: 12.5 g/dL (ref 12.0–15.0)
MCH: 26.8 pg (ref 26.0–34.0)
MCHC: 32.3 g/dL (ref 30.0–36.0)
MCV: 83 fL (ref 80.0–100.0)
Platelets: 318 10*3/uL (ref 150–400)
RBC: 4.66 MIL/uL (ref 3.87–5.11)
RDW: 16.9 % — ABNORMAL HIGH (ref 11.5–15.5)
WBC: 6 10*3/uL (ref 4.0–10.5)
nRBC: 0 % (ref 0.0–0.2)

## 2023-06-07 LAB — BASIC METABOLIC PANEL
Anion gap: 11 (ref 5–15)
BUN: 8 mg/dL (ref 6–20)
CO2: 21 mmol/L — ABNORMAL LOW (ref 22–32)
Calcium: 9 mg/dL (ref 8.9–10.3)
Chloride: 104 mmol/L (ref 98–111)
Creatinine, Ser: 0.65 mg/dL (ref 0.44–1.00)
GFR, Estimated: >60 mL/min (ref >60)
Glucose, Bld: 97 mg/dL (ref 70–99)
Potassium: 3.8 mmol/L (ref 3.5–5.1)
Sodium: 136 mmol/L (ref 135–145)

## 2023-06-07 LAB — POCT PREGNANCY, URINE: Preg Test, Ur: NEGATIVE

## 2023-06-07 LAB — ABO/RH: ABO/RH(D): O POS

## 2023-06-07 SURGERY — EXCISION, CYST, OVARY, LAPAROSCOPIC
Anesthesia: General | Laterality: Right

## 2023-06-07 MED ORDER — LIDOCAINE 2% (20 MG/ML) 5 ML SYRINGE
INTRAMUSCULAR | Status: DC | PRN
  Administered 2023-06-07: 60 mg via INTRAVENOUS

## 2023-06-07 MED ORDER — MIDAZOLAM HCL 2 MG/2ML IJ SOLN
INTRAMUSCULAR | Status: AC
  Filled 2023-06-07: qty 2

## 2023-06-07 MED ORDER — ROCURONIUM BROMIDE 10 MG/ML (PF) SYRINGE
PREFILLED_SYRINGE | INTRAVENOUS | Status: AC
  Filled 2023-06-07: qty 10

## 2023-06-07 MED ORDER — SCOPOLAMINE 1 MG/3DAYS TD PT72
1.0000 | MEDICATED_PATCH | TRANSDERMAL | Status: DC
  Administered 2023-06-07: 1.5 mg via TRANSDERMAL

## 2023-06-07 MED ORDER — IBUPROFEN 200 MG PO TABS: 600.0000 mg | ORAL_TABLET | Freq: Four times a day (QID) | ORAL | 0 refills | Status: DC | PRN

## 2023-06-07 MED ORDER — PROPOFOL 10 MG/ML IV BOLUS
INTRAVENOUS | Status: AC
  Filled 2023-06-07: qty 20

## 2023-06-07 MED ORDER — PROMETHAZINE HCL 25 MG/ML IJ SOLN: 6.2500 mg | INTRAMUSCULAR | Status: DC | PRN

## 2023-06-07 MED ORDER — OXYCODONE HCL 5 MG PO TABS
5.0000 mg | ORAL_TABLET | Freq: Once | ORAL | Status: AC | PRN
  Administered 2023-06-07: 5 mg via ORAL

## 2023-06-07 MED ORDER — ACETAMINOPHEN 500 MG PO TABS
ORAL_TABLET | ORAL | Status: AC
  Filled 2023-06-07: qty 2

## 2023-06-07 MED ORDER — CEFAZOLIN SODIUM-DEXTROSE 2-4 GM/100ML-% IV SOLN
2.0000 g | INTRAVENOUS | Status: AC
  Administered 2023-06-07: 2 g via INTRAVENOUS

## 2023-06-07 MED ORDER — ACETAMINOPHEN 500 MG PO TABS: 500.0000 mg | ORAL_TABLET | Freq: Four times a day (QID) | ORAL | 0 refills | Status: AC | PRN

## 2023-06-07 MED ORDER — BUPIVACAINE HCL (PF) 0.25 % IJ SOLN
INTRAMUSCULAR | Status: DC | PRN
  Administered 2023-06-07: 10 mL

## 2023-06-07 MED ORDER — ROCURONIUM BROMIDE 10 MG/ML (PF) SYRINGE
PREFILLED_SYRINGE | INTRAVENOUS | Status: DC | PRN
  Administered 2023-06-07: 60 mg via INTRAVENOUS

## 2023-06-07 MED ORDER — DEXAMETHASONE SODIUM PHOSPHATE 10 MG/ML IJ SOLN
INTRAMUSCULAR | Status: DC | PRN
  Administered 2023-06-07: 5 mg via INTRAVENOUS

## 2023-06-07 MED ORDER — OXYCODONE HCL 5 MG PO TABS: 5.0000 mg | ORAL_TABLET | ORAL | 0 refills | Status: DC | PRN

## 2023-06-07 MED ORDER — OXYCODONE HCL 5 MG PO TABS
ORAL_TABLET | ORAL | Status: AC
  Filled 2023-06-07: qty 1

## 2023-06-07 MED ORDER — FENTANYL CITRATE (PF) 100 MCG/2ML IJ SOLN
INTRAMUSCULAR | Status: AC
  Filled 2023-06-07: qty 2

## 2023-06-07 MED ORDER — PHENYLEPHRINE 80 MCG/ML (10ML) SYRINGE FOR IV PUSH (FOR BLOOD PRESSURE SUPPORT)
PREFILLED_SYRINGE | INTRAVENOUS | Status: DC | PRN
  Administered 2023-06-07: 80 ug via INTRAVENOUS
  Administered 2023-06-07: 160 ug via INTRAVENOUS
  Administered 2023-06-07: 80 ug via INTRAVENOUS

## 2023-06-07 MED ORDER — ONDANSETRON HCL 4 MG/2ML IJ SOLN
INTRAMUSCULAR | Status: DC | PRN
  Administered 2023-06-07: 4 mg via INTRAVENOUS

## 2023-06-07 MED ORDER — POVIDONE-IODINE 10 % EX SWAB: 2.0000 | Freq: Once | CUTANEOUS | Status: DC

## 2023-06-07 MED ORDER — LACTATED RINGERS IV SOLN: INTRAVENOUS | Status: DC

## 2023-06-07 MED ORDER — LIDOCAINE HCL (PF) 2 % IJ SOLN
INTRAMUSCULAR | Status: AC
  Filled 2023-06-07: qty 5

## 2023-06-07 MED ORDER — MIDAZOLAM HCL 5 MG/5ML IJ SOLN
INTRAMUSCULAR | Status: DC | PRN
  Administered 2023-06-07: 2 mg via INTRAVENOUS

## 2023-06-07 MED ORDER — ACETAMINOPHEN 500 MG PO TABS
1000.0000 mg | ORAL_TABLET | Freq: Once | ORAL | Status: AC
  Administered 2023-06-07: 1000 mg via ORAL

## 2023-06-07 MED ORDER — FENTANYL CITRATE (PF) 100 MCG/2ML IJ SOLN
INTRAMUSCULAR | Status: DC | PRN
  Administered 2023-06-07: 25 ug via INTRAVENOUS
  Administered 2023-06-07: 50 ug via INTRAVENOUS
  Administered 2023-06-07: 25 ug via INTRAVENOUS

## 2023-06-07 MED ORDER — SCOPOLAMINE 1 MG/3DAYS TD PT72
MEDICATED_PATCH | TRANSDERMAL | Status: AC
  Filled 2023-06-07: qty 1

## 2023-06-07 MED ORDER — DEXAMETHASONE SODIUM PHOSPHATE 10 MG/ML IJ SOLN
INTRAMUSCULAR | Status: AC
  Filled 2023-06-07: qty 1

## 2023-06-07 MED ORDER — ACETAMINOPHEN 500 MG PO TABS
ORAL_TABLET | ORAL | Status: AC
  Filled 2023-06-07: qty 1

## 2023-06-07 MED ORDER — OXYCODONE HCL 5 MG/5ML PO SOLN: 5.0000 mg | Freq: Once | ORAL | Status: AC | PRN

## 2023-06-07 MED ORDER — ONDANSETRON HCL 4 MG/2ML IJ SOLN
INTRAMUSCULAR | Status: AC
  Filled 2023-06-07: qty 2

## 2023-06-07 MED ORDER — FENTANYL CITRATE (PF) 100 MCG/2ML IJ SOLN
25.0000 ug | INTRAMUSCULAR | Status: DC | PRN
  Administered 2023-06-07: 50 ug via INTRAVENOUS

## 2023-06-07 MED ORDER — CELECOXIB 200 MG PO CAPS
ORAL_CAPSULE | ORAL | Status: AC
  Filled 2023-06-07: qty 1

## 2023-06-07 MED ORDER — PROPOFOL 10 MG/ML IV BOLUS
INTRAVENOUS | Status: DC | PRN
  Administered 2023-06-07 (×2): 20 mg via INTRAVENOUS
  Administered 2023-06-07: 200 mg via INTRAVENOUS

## 2023-06-07 MED ORDER — ARTIFICIAL TEARS OPHTHALMIC OINT
TOPICAL_OINTMENT | OPHTHALMIC | Status: AC
  Filled 2023-06-07: qty 3.5

## 2023-06-07 MED ORDER — PHENYLEPHRINE 80 MCG/ML (10ML) SYRINGE FOR IV PUSH (FOR BLOOD PRESSURE SUPPORT)
PREFILLED_SYRINGE | INTRAVENOUS | Status: AC
  Filled 2023-06-07: qty 10

## 2023-06-07 MED ORDER — CEFAZOLIN SODIUM-DEXTROSE 2-4 GM/100ML-% IV SOLN
INTRAVENOUS | Status: AC
  Filled 2023-06-07: qty 100

## 2023-06-07 MED ORDER — SUGAMMADEX SODIUM 200 MG/2ML IV SOLN
INTRAVENOUS | Status: DC | PRN
  Administered 2023-06-07: 200 mg via INTRAVENOUS

## 2023-06-07 MED ORDER — SODIUM CHLORIDE 0.9 % IR SOLN
Status: DC | PRN
  Administered 2023-06-07: 1000 mL

## 2023-06-07 MED ORDER — CELECOXIB 200 MG PO CAPS
200.0000 mg | ORAL_CAPSULE | Freq: Once | ORAL | Status: AC
  Administered 2023-06-07: 200 mg via ORAL

## 2023-06-07 SURGICAL SUPPLY — 42 items
ADH SKN CLS APL DERMABOND .7 (GAUZE/BANDAGES/DRESSINGS) ×2
APL SRG 38 LTWT LNG FL B (MISCELLANEOUS) ×2
APPLICATOR ARISTA FLEXITIP XL (MISCELLANEOUS) IMPLANT
CABLE HIGH FREQUENCY MONO STRZ (ELECTRODE) IMPLANT
COVER MAYO STAND STRL (DRAPES) ×2 IMPLANT
DERMABOND ADVANCED .7 DNX12 (GAUZE/BANDAGES/DRESSINGS) ×2 IMPLANT
DRAPE SURG IRRIG POUCH 19X23 (DRAPES) ×2 IMPLANT
DRSG OPSITE POSTOP 3X4 (GAUZE/BANDAGES/DRESSINGS) IMPLANT
DURAPREP 26ML APPLICATOR (WOUND CARE) ×2 IMPLANT
ELECT REM PT RETURN 9FT ADLT (ELECTROSURGICAL) ×2
ELECTRODE REM PT RTRN 9FT ADLT (ELECTROSURGICAL) ×2 IMPLANT
GAUZE 4X4 16PLY ~~LOC~~+RFID DBL (SPONGE) ×4 IMPLANT
GLOVE BIO SURGEON STRL SZ7 (GLOVE) ×2 IMPLANT
GLOVE BIOGEL PI IND STRL 7.0 (GLOVE) ×4 IMPLANT
GOWN STRL REUS W/TWL LRG LVL3 (GOWN DISPOSABLE) ×6 IMPLANT
HEMOSTAT ARISTA ABSORB 3G PWDR (HEMOSTASIS) IMPLANT
IRRIG SUCT STRYKERFLOW 2 WTIP (MISCELLANEOUS) ×2
IRRIGATION SUCT STRKRFLW 2 WTP (MISCELLANEOUS) ×2 IMPLANT
KIT PINK PAD W/HEAD ARE REST (MISCELLANEOUS) ×2
KIT PINK PAD W/HEAD ARM REST (MISCELLANEOUS) ×2 IMPLANT
KIT TURNOVER CYSTO (KITS) ×2 IMPLANT
LIGASURE VESSEL 5MM BLUNT TIP (ELECTROSURGICAL) IMPLANT
MANIPULATOR UTERINE 4.5 ZUMI (MISCELLANEOUS) IMPLANT
NS IRRIG 1000ML POUR BTL (IV SOLUTION) ×2 IMPLANT
PACK LAPAROSCOPY BASIN (CUSTOM PROCEDURE TRAY) ×2 IMPLANT
PROTECTOR NERVE ULNAR (MISCELLANEOUS) ×4 IMPLANT
SCISSORS LAP 5X35 DISP (ENDOMECHANICALS) IMPLANT
SET TUBE SMOKE EVAC HIGH FLOW (TUBING) ×2 IMPLANT
SLEEVE SCD COMPRESS KNEE MED (STOCKING) ×2 IMPLANT
SOL ELECTROSURG ANTI STICK (MISCELLANEOUS) ×2
SOLUTION ELECTROSURG ANTI STCK (MISCELLANEOUS) IMPLANT
SUT VIC AB 4-0 PS2 18 (SUTURE) ×2 IMPLANT
SUT VICRYL 0 UR6 27IN ABS (SUTURE) ×2 IMPLANT
SYR 5ML LL (SYRINGE) IMPLANT
SYS BAG RETRIEVAL 10MM (BASKET) ×2
SYSTEM BAG RETRIEVAL 10MM (BASKET) IMPLANT
TOWEL OR 17X24 6PK STRL BLUE (TOWEL DISPOSABLE) ×2 IMPLANT
TRAP SPECIMEN MUCUS 40CC (MISCELLANEOUS) IMPLANT
TRAY FOLEY W/BAG SLVR 14FR LF (SET/KITS/TRAYS/PACK) ×2 IMPLANT
TROCAR BALLN 12MMX100 BLUNT (TROCAR) IMPLANT
TROCAR Z-THREAD FIOS 5X100MM (TROCAR) IMPLANT
WARMER LAPAROSCOPE (MISCELLANEOUS) ×2 IMPLANT

## 2023-06-07 NOTE — Anesthesia Procedure Notes (Signed)
Procedure Name: Intubation Date/Time: 06/07/2023 7:31 AM  Performed by: Bishop Limbo, CRNAPre-anesthesia Checklist: Patient identified, Emergency Drugs available, Suction available and Patient being monitored Patient Re-evaluated:Patient Re-evaluated prior to induction Oxygen Delivery Method: Circle System Utilized Preoxygenation: Pre-oxygenation with 100% oxygen Induction Type: IV induction Ventilation: Mask ventilation without difficulty Laryngoscope Size: Mac and 3 Grade View: Grade II Tube type: Oral Tube size: 7.0 mm Number of attempts: 1 Airway Equipment and Method: Stylet Placement Confirmation: ETT inserted through vocal cords under direct vision, positive ETCO2 and breath sounds checked- equal and bilateral Secured at: 22 cm Tube secured with: Tape Dental Injury: Teeth and Oropharynx as per pre-operative assessment

## 2023-06-07 NOTE — Anesthesia Postprocedure Evaluation (Signed)
Anesthesia Post Note  Patient: Breanna Byrd  Procedure(s) Performed: LAPAROSCOPIC Right OVARIAN CYSTECTOMY/Pelvic Washings (Right) LAPAROSCOPIC BILATERAL SALPINGECTOMY, Right OOPHORECTOMY (Bilateral)     Patient location during evaluation: PACU Anesthesia Type: General Level of consciousness: awake and alert Pain management: pain level controlled Vital Signs Assessment: post-procedure vital signs reviewed and stable Respiratory status: spontaneous breathing, nonlabored ventilation and respiratory function stable Cardiovascular status: stable and blood pressure returned to baseline Anesthetic complications: no   No notable events documented.  Last Vitals:  Vitals:   06/07/23 1115 06/07/23 1130  BP: 107/66 109/64  Pulse: 67 68  Resp: 16 16  Temp:    SpO2: 98% 98%    Last Pain:  Vitals:   06/07/23 0640  TempSrc: Oral  PainSc: 0-No pain                 Beryle Lathe

## 2023-06-07 NOTE — Op Note (Signed)
06/07/23 Breanna Byrd  Preoperative diagnosis: Large right ovarian cyst with pain.  Postoperative diagnosis: Same, Endometriosis of right ovary Procedure: Laparoscopic right oophorectomy. bilateral salpingectomy Surgeon: Shea Evans, MD Assistants: Clance Boll DO Anesthesia Gen. Endotracheal IV fluids LR  700 cc  EBL minimal 20 cc Urine clear 300 cc clear Complications none Disposition PACU and home Specimens Right ovary with cyst, both fallopian tubes   Procedure:  42 yo with 7-8 cm right ovarian cyst with pain since few weeks. Risk and complications of surgery including infection, bleeding, damage to internal organs, other complications including pneumonia, VTE were reviewed. Bilateral salpingectomy discussed for ovarian cancer risk reduction as was not interested in future child bearing. Informed written consent was obtained and patient was brought to the operating room with IV running. Timeout was carried out. She underwent general anesthesia without difficulty and was given dorsal lithotomy position with arms tucked. Examination under anesthesia revealed retroverted 10 week size uterus. Parts prepped and draped in standard fashion. Foley placed. Speculum was placed anterior lip of cervix was grasped with tenaculum, uterus was sounded to 10 cm and was retroverted. A Zumi manipulator was introduced in the uterine cavity and secured with balloon.  Gloves gown were changed attention was focused on the abdomen. A 10 mm vertical incision was made at the lower edge of umbilicus after injecting 10 cc Marcaine. Incision was carried down to the fascia was incised peritoneal entry was confirmed. Stay suture of 0 Vicryl was taken on the fascia and Hassan cannula was introduced. Insufflation was begun with CO2. A 0 laparoscope was introduced. Trendelenburg given. Entry was uneventful. No bleeding was noted. Two 5 mm ports placed one each on lower quadrant under vision after injection Marcaine.   Large right ovarian cyst noted. Left ovary normal. Both fallopian tubes normal. Pelvic washings obtained. Right ovary enlarged 7-8 cm with cyst and could not be lifted out of the pelvis, seemed adherent. Surface was smooth. Right IP ligament was assessed and desiccated and incised with Ligasure. Right broad ligament was assessed and desiccated and cut staying away from lateral wall. Right uteri-ovarian ligament and proximal end of tube were desiccated and cut. Ovary was adherent to posterior uterine wall. Blunt dissection released copious dark blood consistent with endometriosis. After draining most of it, ovary was dissected off from uterine adhesions. Irrigation noted hemostasis. Left salpingectomy performed with Ligasure and tube was removed from central port.  Now Laparoscope introduced from left lower port and endobag from central port, right tube and ovary was placed in endobag and removed from central port. Hemostasis noted. Arista sprayed on posterior uterine wall. Right ureter noted normal.  Fascial incision closed with 0-Vicryl. Skin was approximated using 4-0 Vicryl in subcuticular fashion. Dermabond was applied at the incision. The ZUMI manipulator and foley removed.   All counts were correct x2 patient was reversed from general anesthesia extubated and brought out to the recovery room in stable condition. Plan is to discharge home from recovery room. Surgical findings were discussed with patient's family. Followup with Dr. Juliene Pina in office in 2 weeks.   I performed this surgery -Shea Evans MD

## 2023-06-07 NOTE — Transfer of Care (Signed)
Immediate Anesthesia Transfer of Care Note  Patient: Breanna Byrd  Procedure(s) Performed: LAPAROSCOPIC Right OVARIAN CYSTECTOMY/Pelvic Washings (Right) LAPAROSCOPIC BILATERAL SALPINGECTOMY, Right OOPHORECTOMY (Bilateral)  Patient Location: PACU  Anesthesia Type:General  Level of Consciousness: awake, alert , oriented, and patient cooperative  Airway & Oxygen Therapy: Patient Spontanous Breathing and Patient connected to face mask oxygen  Post-op Assessment: Report given to RN and Post -op Vital signs reviewed and stable  Post vital signs: Reviewed and stable  Last Vitals:  Vitals Value Taken Time  BP 105/65 06/07/23 0919  Temp 36.3 C 06/07/23 0919  Pulse 68 06/07/23 0919  Resp 21 06/07/23 0919  SpO2 99 % 06/07/23 0919  Vitals shown include unvalidated device data.  Last Pain:  Vitals:   06/07/23 0640  TempSrc: Oral  PainSc: 0-No pain      Patients Stated Pain Goal: 5 (06/07/23 0640)  Complications: No notable events documented.

## 2023-06-07 NOTE — Discharge Instructions (Addendum)

## 2023-06-08 ENCOUNTER — Encounter (HOSPITAL_BASED_OUTPATIENT_CLINIC_OR_DEPARTMENT_OTHER): Payer: Self-pay | Admitting: Obstetrics & Gynecology

## 2023-06-08 LAB — CYTOLOGY - NON PAP

## 2023-06-09 LAB — SURGICAL PATHOLOGY

## 2023-06-21 ENCOUNTER — Ambulatory Visit: Payer: BC Managed Care – PPO | Admitting: Neurology

## 2023-06-22 ENCOUNTER — Ambulatory Visit: Payer: BC Managed Care – PPO | Admitting: Family Medicine

## 2023-07-05 ENCOUNTER — Other Ambulatory Visit: Payer: Self-pay | Admitting: General Surgery

## 2023-07-05 DIAGNOSIS — D373 Neoplasm of uncertain behavior of appendix: Secondary | ICD-10-CM

## 2023-07-09 ENCOUNTER — Encounter: Payer: Self-pay | Admitting: Family Medicine

## 2023-08-12 ENCOUNTER — Ambulatory Visit: Payer: BC Managed Care – PPO | Admitting: Skilled Nursing Facility1

## 2023-08-17 ENCOUNTER — Ambulatory Visit
Admission: RE | Admit: 2023-08-17 | Discharge: 2023-08-17 | Disposition: A | Discharge status: Home / Self Care | Payer: BC Managed Care – PPO | Source: Ambulatory Visit | Attending: General Surgery | Admitting: General Surgery

## 2023-08-17 DIAGNOSIS — D373 Neoplasm of uncertain behavior of appendix: Secondary | ICD-10-CM

## 2023-08-17 MED ORDER — IOPAMIDOL (ISOVUE-370) INJECTION 76%
80.0000 mL | Freq: Once | INTRAVENOUS | Status: AC | PRN
  Administered 2023-08-17: 80 mL via INTRAVENOUS

## 2023-08-25 ENCOUNTER — Ambulatory Visit: Payer: BC Managed Care – PPO | Admitting: Dietician

## 2023-09-01 ENCOUNTER — Ambulatory Visit (INDEPENDENT_AMBULATORY_CARE_PROVIDER_SITE_OTHER): Payer: BC Managed Care – PPO | Admitting: Family Medicine

## 2023-09-01 ENCOUNTER — Encounter: Payer: Self-pay | Admitting: Family Medicine

## 2023-09-01 VITALS — BP 122/82 | HR 74 | Temp 98.1°F | Resp 18 | Ht 64.0 in | Wt 221.2 lb

## 2023-09-01 DIAGNOSIS — D508 Other iron deficiency anemias: Secondary | ICD-10-CM | POA: Diagnosis not present

## 2023-09-01 DIAGNOSIS — R7303 Prediabetes: Secondary | ICD-10-CM | POA: Diagnosis not present

## 2023-09-01 DIAGNOSIS — E782 Mixed hyperlipidemia: Secondary | ICD-10-CM | POA: Diagnosis not present

## 2023-09-01 DIAGNOSIS — E538 Deficiency of other specified B group vitamins: Secondary | ICD-10-CM | POA: Diagnosis not present

## 2023-09-01 DIAGNOSIS — E559 Vitamin D deficiency, unspecified: Secondary | ICD-10-CM | POA: Diagnosis not present

## 2023-09-01 DIAGNOSIS — Z6837 Body mass index (BMI) 37.0-37.9, adult: Secondary | ICD-10-CM

## 2023-09-01 DIAGNOSIS — E66812 Obesity, class 2: Secondary | ICD-10-CM

## 2023-09-01 LAB — CBC WITH DIFFERENTIAL/PLATELET
Basophils Absolute: 0 10*3/uL (ref 0.0–0.1)
Basophils Relative: 0.7 % (ref 0.0–3.0)
Eosinophils Absolute: 0.1 10*3/uL (ref 0.0–0.7)
Eosinophils Relative: 2.5 % (ref 0.0–5.0)
HCT: 40 % (ref 36.0–46.0)
Hemoglobin: 13 g/dL (ref 12.0–15.0)
Lymphocytes Relative: 29.7 % (ref 12.0–46.0)
Lymphs Abs: 1.7 10*3/uL (ref 0.7–4.0)
MCHC: 32.4 g/dL (ref 30.0–36.0)
MCV: 84.7 fl (ref 78.0–100.0)
Monocytes Absolute: 0.4 10*3/uL (ref 0.1–1.0)
Monocytes Relative: 7.3 % (ref 3.0–12.0)
Neutro Abs: 3.3 10*3/uL (ref 1.4–7.7)
Neutrophils Relative %: 59.8 % (ref 43.0–77.0)
Platelets: 342 10*3/uL (ref 150.0–400.0)
RBC: 4.73 Mil/uL (ref 3.87–5.11)
RDW: 15.2 % (ref 11.5–15.5)
WBC: 5.6 10*3/uL (ref 4.0–10.5)

## 2023-09-01 LAB — COMPREHENSIVE METABOLIC PANEL
ALT: 15 U/L (ref 0–35)
AST: 14 U/L (ref 0–37)
Albumin: 4 g/dL (ref 3.5–5.2)
Alkaline Phosphatase: 64 U/L (ref 39–117)
BUN: 7 mg/dL (ref 6–23)
CO2: 28 meq/L (ref 19–32)
Calcium: 9.3 mg/dL (ref 8.4–10.5)
Chloride: 101 meq/L (ref 96–112)
Creatinine, Ser: 0.61 mg/dL (ref 0.40–1.20)
GFR: 109.34 mL/min (ref >60.00)
Glucose, Bld: 82 mg/dL (ref 70–99)
Potassium: 4.1 meq/L (ref 3.5–5.1)
Sodium: 136 meq/L (ref 135–145)
Total Bilirubin: 0.5 mg/dL (ref 0.2–1.2)
Total Protein: 7 g/dL (ref 6.0–8.3)

## 2023-09-01 LAB — IBC + FERRITIN
Ferritin: 9.9 ng/mL — ABNORMAL LOW (ref 10.0–291.0)
Iron: 90 ug/dL (ref 42–145)
Saturation Ratios: 17 % — ABNORMAL LOW (ref 20.0–50.0)
TIBC: 529.2 ug/dL — ABNORMAL HIGH (ref 250.0–450.0)
Transferrin: 378 mg/dL — ABNORMAL HIGH (ref 212.0–360.0)

## 2023-09-01 LAB — LIPID PANEL
Cholesterol: 263 mg/dL — ABNORMAL HIGH (ref 0–200)
HDL: 52.3 mg/dL (ref >39.00)
LDL Cholesterol: 159 mg/dL — ABNORMAL HIGH (ref 0–99)
NonHDL: 210.71
Total CHOL/HDL Ratio: 5
Triglycerides: 257 mg/dL — ABNORMAL HIGH (ref 0.0–149.0)
VLDL: 51.4 mg/dL — ABNORMAL HIGH (ref 0.0–40.0)

## 2023-09-01 LAB — VITAMIN B12: Vitamin B-12: 153 pg/mL — ABNORMAL LOW (ref 211–911)

## 2023-09-01 LAB — HEMOGLOBIN A1C: Hgb A1c MFr Bld: 5.7 % (ref 4.6–6.5)

## 2023-09-01 LAB — VITAMIN D 25 HYDROXY (VIT D DEFICIENCY, FRACTURES): VITD: 20.8 ng/mL — ABNORMAL LOW (ref 30.00–100.00)

## 2023-09-01 MED ORDER — CYANOCOBALAMIN 1000 MCG/ML IJ SOLN
1000.0000 ug | Freq: Once | INTRAMUSCULAR | Status: AC
Start: 2023-09-01 — End: 2023-09-01
  Administered 2023-09-01: 1000 ug via INTRAMUSCULAR

## 2023-09-01 NOTE — Progress Notes (Signed)
Subjective:     Patient ID: Breanna Byrd, female    DOB: 04/17/1981, 42 y.o.   MRN: [id number]  Chief Complaint  Patient presents with   Follow-up    3 month follow-up Fasting     HPI Pre DM - Complaint with metformin 500 mg once daily. States she walks every other day and has been trying to follow a healthy diet for the past 3 months.   Anemia - Is taking ferrous sulfate 325 mg BID. Reports heavy periods but no irregular vaginal bleeding when not on her cycle.    Vitamin B12 Deficiency - States she has missed her monthly injections due to her recent surgery. Over the past 10 days, she has taken B 12 orally. Pt is vegetarian  Vitamin D Deficiency - Reports she is not taking her vitamin D regularly.  5.  Baker Pierini been working on diet/exercise.  When she saw the cardiologist in April, he prescribed Wegovy, but it was not approved by her insurance until she has done 3 months of diet/exercise.  She did see a nutritionist in June.  Has struggled a little bit with exercise due to having surgery in June and recovering  6.  HLD-has been working on diet/exercise.  Prefers not taking a statin   There are no preventive care reminders to display for this patient.   Past Medical History:  Diagnosis Date   B12 deficiency    Cyst of ovary 05/2023   large   Cyst of right ovary 06/07/2023   Dyspnea    Fatty liver    History of syncope 03/14/2023   ED visit in epic;  neurology evalution by dr dohmeier in epic 03-22-2023 w/ normal EEG;   cardiology evaluation by dr Jacinto Halim 04-20-2023  w/ normal ETT, echo, and event monitor   Hyperlipidemia, mixed    IDA (iron deficiency anemia)    Nephrolithiasis 2022   per imaging in epic bilateral nonobstructive  (06-01-2023  that she know's of they have not passed)   Pelvic pain in female 05/2023   from large ovarian cyst   Pre-diabetes    Vitamin D deficiency    Wears contact lenses     Past Surgical History:  Procedure Laterality Date    COLONOSCOPY W/ POLYPECTOMY  08/26/2022   dr Barron Alvine   LAPAROSCOPIC APPENDECTOMY N/A 10/08/2022   Procedure: APPENDECTOMY LAPAROSCOPIC;  Surgeon: Almond Lint, MD;  Location: [facility];  Service: General;  Laterality: N/A;   LAPAROSCOPIC BILATERAL SALPINGO OOPHERECTOMY Bilateral 06/07/2023   Procedure: LAPAROSCOPIC BILATERAL SALPINGECTOMY, Right OOPHORECTOMY;  Surgeon: Shea Evans, MD;  Location: [facility];  Service: Gynecology;  Laterality: Bilateral;   LAPAROSCOPIC OVARIAN CYSTECTOMY Right 06/07/2023   Procedure: LAPAROSCOPIC Right OVARIAN CYSTECTOMY/Pelvic Washings;  Surgeon: Shea Evans, MD;  Location: [facility];  Service: Gynecology;  Laterality: Right;  Requests 2hrs.   UMBILICAL HERNIA REPAIR  10/08/2022   Procedure: HERNIA REPAIR UMBILICAL ADULT;  Surgeon: Almond Lint, MD;  Location: [facility] OR;  Service: General;;     Current Outpatient Medications:    acetaminophen (TYLENOL) 500 MG tablet, Take 1 tablet (500 mg total) by mouth every 6 (six) hours as needed., Disp: 30 tablet, Rfl: 0   ferrous sulfate 325 (65 FE) MG EC tablet, Take 325 mg by mouth 2 (two) times daily., Disp: , Rfl:    metFORMIN (GLUCOPHAGE) 500 MG tablet, Take 500 mg by mouth daily with breakfast., Disp: , Rfl:    Vitamin D, Ergocalciferol, (DRISDOL) 1.25 MG (50000  UNIT) CAPS capsule, twice weekly for 4 wks, then weekly for 2 more months. (Patient taking differently: Take 50,000 Units by mouth every 7 (seven) days. twice weekly for 4 wks, then weekly for 2 more months./   weekly on Tuesday's), Disp: 14 capsule, Rfl: 3  No Known Allergies ROS neg/noncontributory except as noted HPI/below      Objective:     BP 122/82   Pulse 74   Temp 98.1 F (36.7 C) (Temporal)   Resp 18   Ht 5\' 4"  (1.626 m)   Wt 221 lb 4 oz (100.4 kg)   LMP 08/13/2023 (Exact Date)   SpO2 97%   BMI 37.98 kg/m  Wt Readings from Last 3 Encounters:  09/01/23 221 lb 4 oz (100.4 kg)  06/07/23 220 lb (99.8  kg)  06/04/23 218 lb 12.8 oz (99.2 kg)    Physical Exam   Gen: WDWN NAD HEENT: NCAT, conjunctiva not injected, sclera nonicteric NECK:  supple, no thyromegaly, no nodes, no carotid bruits CARDIAC: RRR, S1S2+, no murmur. DP 2+B LUNGS: CTAB. No wheezes ABDOMEN:  BS+, soft, NTND, No HSM, no masses EXT:  no edema MSK: no gross abnormalities.  NEURO: A&O x3.  CN II-XII intact.  PSYCH: normal mood. Good eye contact  +small skin tag left axilla     Assessment & Plan:  Prediabetes Assessment & Plan: Chronic.  Question control.  She has been working some on diet exercise.  Has not lost any weight.  Check labs.  Continue metformin 500 mg daily  Orders: -     Comprehensive metabolic panel -     Hemoglobin A1c  Hyperlipidemia, mixed Assessment & Plan: Chronic.  Not controlled.  She does have a family history of coronary artery disease.  Cardiologist recommended taking a statin.  She prefers no meds.  Working some on diet exercise.  Check labs  Orders: -     Lipid panel  Iron deficiency anemia secondary to inadequate dietary iron intake Assessment & Plan: Chronic.  She does have some associated anemia from this.  Does have some heavy menses.  Patient has been taking her iron supplements twice daily.  Will check labs  Orders: -     CBC with Differential/Platelet -     IBC + Ferritin  B12 deficiency Assessment & Plan: Chronic.  Not controlled.  She is vegetarian.  Had been getting injections weekly x 4 but then no follow-up since May due to having surgery in June.  For the past 10 days, she has started taking a B12 supplement over-the-counter.  Will check levels today.  Injection given today.  Patient is not great at taking meds, so she would prefer to come in for monthly injections.  Orders: -     Vitamin B12 -     Cyanocobalamin  Vitamin D deficiency Assessment & Plan: Chronic.  Suspect from dietary deficiency.  She has not been taking her supplements regularly.  Check  labs  Orders: -     VITAMIN D 25 Hydroxy (Vit-D Deficiency, Fractures)  Class 2 severe obesity with serious comorbidity and body mass index (BMI) of 37.0 to 37.9 in adult, unspecified obesity type (HCC)  Obesity-patient has mixed hyperlipidemia, prediabetes.  Also, family history coronary artery disease.  Has been working on diet/exercise.  Not losing any weight.  She is considering 77.  She needed to do a trial of 3 months of diet/exercise (which she has done) she is still considering taking the medication.  She will let us  know what she decides.  Return in about 6 months (around 02/29/2024) for me in 72mo.   monthly nurse visit B12.   Germaine Pomfret Rice,acting as a scribe for Angelena Sole, MD.,have documented all relevant documentation on the behalf of Angelena Sole, MD,as directed by  Angelena Sole, MD while in the presence of Angelena Sole, MD.  I, Angelena Sole, MD, have reviewed all documentation for this visit. The documentation on 09/01/23 for the exam, diagnosis, procedures, and orders are all accurate and complete.    Angelena Sole, MD

## 2023-09-01 NOTE — Assessment & Plan Note (Signed)
Chronic.  Suspect from dietary deficiency.  She has not been taking her supplements regularly.  Check labs

## 2023-09-01 NOTE — Assessment & Plan Note (Signed)
Chronic.  Question control.  She has been working some on diet exercise.  Has not lost any weight.  Check labs.  Continue metformin 500 mg daily

## 2023-09-01 NOTE — Assessment & Plan Note (Signed)
Chronic.  Not controlled.  She is vegetarian.  Had been getting injections weekly x 4 but then no follow-up since May due to having surgery in June.  For the past 10 days, she has started taking a B12 supplement over-the-counter.  Will check levels today.  Injection given today.  Patient is not great at taking meds, so she would prefer to come in for monthly injections.

## 2023-09-01 NOTE — Assessment & Plan Note (Signed)
Chronic.  She does have some associated anemia from this.  Does have some heavy menses.  Patient has been taking her iron supplements twice daily.  Will check labs

## 2023-09-01 NOTE — Assessment & Plan Note (Signed)
Chronic.  Not controlled.  She does have a family history of coronary artery disease.  Cardiologist recommended taking a statin.  She prefers no meds.  Working some on diet exercise.  Check labs

## 2023-09-01 NOTE — Patient Instructions (Signed)

## 2023-09-02 NOTE — Progress Notes (Signed)
1.  B12 low as we expected.  Make sure coming in monthly for injections 2.  Vitamin d low-need to do better at taking it 3.  Cholesterol is too high-she needs to start on meds.  Crestor 10mg  as recommended by Dr. Jacinto Halim 4.  Anemia is better, but iron stores are still low-continue the iron twice daily.  5.  Sugars some better.  Continue metformin and working on diet/exercise Schedule f/u appt w/me in 3 months as I will need to  order/repeat labs

## 2023-09-03 ENCOUNTER — Other Ambulatory Visit: Payer: Self-pay | Admitting: General Surgery

## 2023-09-03 DIAGNOSIS — N83202 Unspecified ovarian cyst, left side: Secondary | ICD-10-CM

## 2023-09-07 ENCOUNTER — Other Ambulatory Visit: Payer: Self-pay | Admitting: *Deleted

## 2023-09-07 ENCOUNTER — Telehealth: Payer: Self-pay | Admitting: Family Medicine

## 2023-09-07 DIAGNOSIS — E782 Mixed hyperlipidemia: Secondary | ICD-10-CM

## 2023-09-07 MED ORDER — ROSUVASTATIN CALCIUM 10 MG PO TABS
10.0000 mg | ORAL_TABLET | Freq: Every day | ORAL | 0 refills | Status: DC
Start: 2023-09-07 — End: 2023-12-09

## 2023-09-07 NOTE — Telephone Encounter (Signed)
Patient was returning call in regards to her lab results. Requests call back when able.

## 2023-09-07 NOTE — Telephone Encounter (Signed)
Returned call to patient. Patient decided that she wanted medication sent to the pharmacy.

## 2023-09-21 DIAGNOSIS — Z30430 Encounter for insertion of intrauterine contraceptive device: Secondary | ICD-10-CM | POA: Diagnosis not present

## 2023-10-20 DIAGNOSIS — Z30431 Encounter for routine checking of intrauterine contraceptive device: Secondary | ICD-10-CM | POA: Diagnosis not present

## 2023-10-29 ENCOUNTER — Telehealth: Payer: Self-pay | Admitting: Family Medicine

## 2023-10-29 NOTE — Telephone Encounter (Signed)
Per last result note in September from Dr. Ruthine Dose: Make sure coming in monthly for injections

## 2023-10-29 NOTE — Telephone Encounter (Signed)
Patient called to schedule B12 injection. States she was getting one once a month for three months, but last one was in September. Can patient carry on as normal or do there need to be adjustments in frequency?

## 2023-11-02 ENCOUNTER — Ambulatory Visit (INDEPENDENT_AMBULATORY_CARE_PROVIDER_SITE_OTHER): Payer: BC Managed Care – PPO | Admitting: *Deleted

## 2023-11-02 DIAGNOSIS — E538 Deficiency of other specified B group vitamins: Secondary | ICD-10-CM

## 2023-11-02 MED ORDER — CYANOCOBALAMIN 1000 MCG/ML IJ SOLN
1000.0000 ug | Freq: Once | INTRAMUSCULAR | Status: AC
Start: 2023-11-02 — End: 2023-11-02
  Administered 2023-11-02: 1000 ug via INTRAMUSCULAR

## 2023-11-02 NOTE — Progress Notes (Signed)
Per orders of Dr. Ruthine Dose, injection of monthly B 12 given in right deltoid per patient preference by Jobe Gibbon, CMA. Patient tolerated injection well.

## 2023-11-15 ENCOUNTER — Encounter: Payer: Self-pay | Admitting: Family Medicine

## 2023-11-16 NOTE — Telephone Encounter (Signed)
Patient called stating she wasn't able to understand below message fully. Patient requests to speak with Fausto Skillern about the message.

## 2023-11-16 NOTE — Telephone Encounter (Signed)
Patient confirmed understanding and stated she would call to get scheduled with someone.

## 2023-11-17 ENCOUNTER — Ambulatory Visit: Payer: BC Managed Care – PPO | Admitting: Family

## 2023-11-24 ENCOUNTER — Encounter: Payer: Self-pay | Admitting: Family Medicine

## 2023-11-24 ENCOUNTER — Ambulatory Visit (INDEPENDENT_AMBULATORY_CARE_PROVIDER_SITE_OTHER): Payer: BC Managed Care – PPO | Admitting: Family Medicine

## 2023-11-24 VITALS — BP 116/70 | HR 73 | Temp 97.9°F | Resp 18 | Ht 64.0 in | Wt 226.2 lb

## 2023-11-24 DIAGNOSIS — R109 Unspecified abdominal pain: Secondary | ICD-10-CM

## 2023-11-24 LAB — POC URINALSYSI DIPSTICK (AUTOMATED)
Bilirubin, UA: NEGATIVE
Blood, UA: NEGATIVE
Glucose, UA: NEGATIVE
Ketones, UA: NEGATIVE
Leukocytes, UA: NEGATIVE
Nitrite, UA: NEGATIVE
Protein, UA: NEGATIVE
Spec Grav, UA: 1.015 (ref 1.010–1.025)
Urobilinogen, UA: 0.2 U/dL
pH, UA: 7 (ref 5.0–8.0)

## 2023-11-24 NOTE — Progress Notes (Signed)
Subjective:     Patient ID: Breanna Byrd, female    DOB: 03/18/1981, 42 y.o.   MRN: [id number]  Chief Complaint  Patient presents with   Back Pain    Left sided sharp back pain that started 20 days ago, radiates to mid back at times    HPI Sharp L back pain for 3 wks but pt thinks more the belly.  Radiates up to mid back at times.  No specific injury    Continuous but different degrees.   Started 2 days before menses but then continued on.  No injury, not moving furniture, etc.  Sharp pain.  Past 2 days, no pain.   No rad.  No dysuria, weakness,n/t.  Ibu helped some, but only took twice.  No f/c.  No abd pain.  Had u/s at gyn after IUD in Oct.  There are no preventive care reminders to display for this patient.   Past Medical History:  Diagnosis Date   B12 deficiency    Cyst of ovary 05/2023   large   Cyst of right ovary 06/07/2023   Dyspnea    Fatty liver    History of syncope 03/14/2023   ED visit in epic;  neurology evalution by dr dohmeier in epic 03-22-2023 w/ normal EEG;   cardiology evaluation by dr Jacinto Halim 04-20-2023  w/ normal ETT, echo, and event monitor   Hyperlipidemia, mixed    IDA (iron deficiency anemia)    Nephrolithiasis 2022   per imaging in epic bilateral nonobstructive  (06-01-2023  that she know's of they have not passed)   Pelvic pain in female 05/2023   from large ovarian cyst   Pre-diabetes    Vitamin D deficiency    Wears contact lenses     Past Surgical History:  Procedure Laterality Date   COLONOSCOPY W/ POLYPECTOMY  08/26/2022   dr Barron Alvine   LAPAROSCOPIC APPENDECTOMY N/A 10/08/2022   Procedure: APPENDECTOMY LAPAROSCOPIC;  Surgeon: Almond Lint, MD;  Location: [facility] OR;  Service: General;  Laterality: N/A;   LAPAROSCOPIC BILATERAL SALPINGO OOPHERECTOMY Bilateral 06/07/2023   Procedure: LAPAROSCOPIC BILATERAL SALPINGECTOMY, Right OOPHORECTOMY;  Surgeon: Shea Evans, MD;  Location: [facility];  Service: Gynecology;   Laterality: Bilateral;   LAPAROSCOPIC OVARIAN CYSTECTOMY Right 06/07/2023   Procedure: LAPAROSCOPIC Right OVARIAN CYSTECTOMY/Pelvic Washings;  Surgeon: Shea Evans, MD;  Location: [facility];  Service: Gynecology;  Laterality: Right;  Requests 2hrs.   UMBILICAL HERNIA REPAIR  10/08/2022   Procedure: HERNIA REPAIR UMBILICAL ADULT;  Surgeon: Almond Lint, MD;  Location: [facility];  Service: General;;     Current Outpatient Medications:    acetaminophen (TYLENOL) 500 MG tablet, Take 1 tablet (500 mg total) by mouth every 6 (six) hours as needed., Disp: 30 tablet, Rfl: 0   ferrous sulfate 325 (65 FE) MG EC tablet, Take 325 mg by mouth 2 (two) times daily., Disp: , Rfl:    levonorgestrel (MIRENA, 52 MG,) 20 MCG/DAY IUD, Take 1 device by intrauterine route., Disp: , Rfl:    metFORMIN (GLUCOPHAGE) 500 MG tablet, Take 500 mg by mouth daily with breakfast., Disp: , Rfl:    rosuvastatin (CRESTOR) 10 MG tablet, Take 1 tablet (10 mg total) by mouth daily., Disp: 90 tablet, Rfl: 0   Vitamin D, Ergocalciferol, (DRISDOL) 1.25 MG (50000 UNIT) CAPS capsule, twice weekly for 4 wks, then weekly for 2 more months. (Patient taking differently: Take 50,000 Units by mouth every 7 (seven) days. twice weekly for 4 wks, then  weekly for 2 more months./   weekly on Tuesday's), Disp: 14 capsule, Rfl: 3  No Known Allergies ROS neg/noncontributory except as noted HPI/below      Objective:     BP 116/70   Pulse 73   Temp 97.9 F (36.6 C) (Temporal)   Resp 18   Ht 5\' 4"  (1.626 m)   Wt 226 lb 4 oz (102.6 kg)   SpO2 97%   BMI 38.84 kg/m  Wt Readings from Last 3 Encounters:  11/24/23 226 lb 4 oz (102.6 kg)  09/01/23 221 lb 4 oz (100.4 kg)  06/07/23 220 lb (99.8 kg)    Physical Exam   Gen: WDWN NAD HEENT: NCAT, conjunctiva not injected, sclera nonicteric ABDOMEN:  BS+, soft, NTND, No HSM, no masses EXT:  no edema MSK: no gross abnormalities.  NEURO: A&O x3.  CN II-XII intact.  PSYCH: normal  mood. Good eye contact  Back:  can stand on heels/toes/1 leg.  No TTP. No SI tenderness. No rash.  MS 5/5 BLE.  DTR 2+ BLE.  SLR neg B.  Good ROM .   Good rom hips and no TTP  Reviewed CT abd report.   Results for orders placed or performed in visit on 11/24/23  POCT Urinalysis Dipstick (Automated)  Result Value Ref Range   Color, UA YELLOW    Clarity, UA CLEAR    Glucose, UA Negative Negative   Bilirubin, UA NEG    Ketones, UA NEG    Spec Grav, UA 1.015 1.010 - 1.025   Blood, UA NEG    pH, UA 7.0 5.0 - 8.0   Protein, UA Negative Negative   Urobilinogen, UA 0.2 0.2 or 1.0 E.U./dL   Nitrite, UA NEG    Leukocytes, UA Negative Negative       Assessment & Plan:  Flank pain -     POCT Urinalysis Dipstick (Automated)  Flank pain-not sure if musculoskeletal, abd, other.  Resolved now.  Had IUD placed 10/1.  UA unremarkable.  Discussed monitoring, doing x-rays, CT, etc.  Will monitor for now.   Return for as sch 12/17.  Angelena Sole, MD

## 2023-11-24 NOTE — Patient Instructions (Signed)

## 2023-12-02 DIAGNOSIS — Z1231 Encounter for screening mammogram for malignant neoplasm of breast: Secondary | ICD-10-CM | POA: Diagnosis not present

## 2023-12-07 ENCOUNTER — Ambulatory Visit: Payer: BC Managed Care – PPO | Admitting: Family Medicine

## 2023-12-09 ENCOUNTER — Other Ambulatory Visit: Payer: Self-pay | Admitting: Family Medicine

## 2023-12-09 DIAGNOSIS — E782 Mixed hyperlipidemia: Secondary | ICD-10-CM

## 2023-12-17 ENCOUNTER — Encounter: Payer: Self-pay | Admitting: Family Medicine

## 2023-12-17 ENCOUNTER — Ambulatory Visit (INDEPENDENT_AMBULATORY_CARE_PROVIDER_SITE_OTHER): Payer: BC Managed Care – PPO | Admitting: Family Medicine

## 2023-12-17 VITALS — BP 124/78 | HR 76 | Temp 97.5°F | Resp 18 | Ht 64.0 in | Wt 229.5 lb

## 2023-12-17 DIAGNOSIS — E538 Deficiency of other specified B group vitamins: Secondary | ICD-10-CM

## 2023-12-17 DIAGNOSIS — R7303 Prediabetes: Secondary | ICD-10-CM | POA: Diagnosis not present

## 2023-12-17 DIAGNOSIS — E559 Vitamin D deficiency, unspecified: Secondary | ICD-10-CM

## 2023-12-17 DIAGNOSIS — D508 Other iron deficiency anemias: Secondary | ICD-10-CM

## 2023-12-17 DIAGNOSIS — E782 Mixed hyperlipidemia: Secondary | ICD-10-CM | POA: Diagnosis not present

## 2023-12-17 LAB — LIPID PANEL
Cholesterol: 232 mg/dL — ABNORMAL HIGH (ref 0–200)
HDL: 51.5 mg/dL (ref >39.00)
LDL Cholesterol: 129 mg/dL — ABNORMAL HIGH (ref 0–99)
NonHDL: 180.64
Total CHOL/HDL Ratio: 5
Triglycerides: 259 mg/dL — ABNORMAL HIGH (ref 0.0–149.0)
VLDL: 51.8 mg/dL — ABNORMAL HIGH (ref 0.0–40.0)

## 2023-12-17 LAB — CBC WITH DIFFERENTIAL/PLATELET
Basophils Absolute: 0 10*3/uL (ref 0.0–0.1)
Basophils Relative: 0.3 % (ref 0.0–3.0)
Eosinophils Absolute: 0.2 10*3/uL (ref 0.0–0.7)
Eosinophils Relative: 3.1 % (ref 0.0–5.0)
HCT: 38.5 % (ref 36.0–46.0)
Hemoglobin: 12.5 g/dL (ref 12.0–15.0)
Lymphocytes Relative: 30.4 % (ref 12.0–46.0)
Lymphs Abs: 1.9 10*3/uL (ref 0.7–4.0)
MCHC: 32.4 g/dL (ref 30.0–36.0)
MCV: 83.9 fL (ref 78.0–100.0)
Monocytes Absolute: 0.5 10*3/uL (ref 0.1–1.0)
Monocytes Relative: 7.7 % (ref 3.0–12.0)
Neutro Abs: 3.7 10*3/uL (ref 1.4–7.7)
Neutrophils Relative %: 58.5 % (ref 43.0–77.0)
Platelets: 308 10*3/uL (ref 150.0–400.0)
RBC: 4.59 Mil/uL (ref 3.87–5.11)
RDW: 15.1 % (ref 11.5–15.5)
WBC: 6.3 10*3/uL (ref 4.0–10.5)

## 2023-12-17 LAB — HEMOGLOBIN A1C: Hgb A1c MFr Bld: 6.1 % (ref 4.6–6.5)

## 2023-12-17 LAB — COMPREHENSIVE METABOLIC PANEL
ALT: 14 U/L (ref 0–35)
AST: 15 U/L (ref 0–37)
Albumin: 3.8 g/dL (ref 3.5–5.2)
Alkaline Phosphatase: 55 U/L (ref 39–117)
BUN: 8 mg/dL (ref 6–23)
CO2: 26 meq/L (ref 19–32)
Calcium: 8.5 mg/dL (ref 8.4–10.5)
Chloride: 104 meq/L (ref 96–112)
Creatinine, Ser: 0.56 mg/dL (ref 0.40–1.20)
GFR: 111.38 mL/min (ref >60.00)
Glucose, Bld: 92 mg/dL (ref 70–99)
Potassium: 3.9 meq/L (ref 3.5–5.1)
Sodium: 138 meq/L (ref 135–145)
Total Bilirubin: 0.4 mg/dL (ref 0.2–1.2)
Total Protein: 6.4 g/dL (ref 6.0–8.3)

## 2023-12-17 LAB — IBC + FERRITIN
Ferritin: 4.8 ng/mL — ABNORMAL LOW (ref 10.0–291.0)
Iron: 43 ug/dL (ref 42–145)
Saturation Ratios: 8.6 % — ABNORMAL LOW (ref 20.0–50.0)
TIBC: 502.6 ug/dL — ABNORMAL HIGH (ref 250.0–450.0)
Transferrin: 359 mg/dL (ref 212.0–360.0)

## 2023-12-17 LAB — VITAMIN D 25 HYDROXY (VIT D DEFICIENCY, FRACTURES): VITD: 15.92 ng/mL — ABNORMAL LOW (ref 30.00–100.00)

## 2023-12-17 LAB — VITAMIN B12: Vitamin B-12: 183 pg/mL — ABNORMAL LOW (ref 211–911)

## 2023-12-17 MED ORDER — METFORMIN HCL 500 MG PO TABS: 500.0000 mg | ORAL_TABLET | Freq: Every day | ORAL | 1 refills | Status: DC

## 2023-12-17 MED ORDER — CYANOCOBALAMIN 1000 MCG/ML IJ SOLN
1000.0000 ug | Freq: Once | INTRAMUSCULAR | Status: AC
  Administered 2023-12-17: 1000 ug via INTRAMUSCULAR

## 2023-12-17 NOTE — Assessment & Plan Note (Signed)
Chronic.  good control.  She has been working some on diet exercise.  Has not lost any weight.   Continue metformin 500 mg daily

## 2023-12-17 NOTE — Patient Instructions (Signed)
It was very nice to see you today!  Happy New Year!   PLEASE NOTE:  If you had any lab tests please let us know if you have not heard back within a few days. You may see your results on MyChart before we have a chance to review them but we will give you a call once they are reviewed by us. If we ordered any referrals today, please let us know if you have not heard from their office within the next week.   Please try these tips to maintain a healthy lifestyle:  Eat most of your calories during the day when you are active. Eliminate processed foods including packaged sweets (pies, cakes, cookies), reduce intake of potatoes, white bread, white pasta, and white rice. Look for whole grain options, oat flour or almond flour.  Each meal should contain half fruits/vegetables, one quarter protein, and one quarter carbs (no bigger than a computer mouse).  Cut down on sweet beverages. This includes juice, soda, and sweet tea. Also watch fruit intake, though this is a healthier sweet option, it still contains natural sugar! Limit to 3 servings daily.  Drink at least 1 glass of water with each meal and aim for at least 8 glasses per day  Exercise at least 150 minutes every week.   

## 2023-12-17 NOTE — Assessment & Plan Note (Signed)
Chronic.  Not controlled.  She does have a family history of coronary artery disease.  Cardiologist recommended taking a statin.  On crestor 10mg  daily now

## 2023-12-17 NOTE — Assessment & Plan Note (Signed)
Chronic.  She does have some associated anemia from this.  Does have some heavy menses but just got IUD and less bleeding.  Patient has been taking her iron supplements occ.  Will check labs

## 2023-12-17 NOTE — Assessment & Plan Note (Signed)
Chronic.  Suspect from dietary deficiency.  Taking D 50k weekly now

## 2023-12-17 NOTE — Progress Notes (Signed)
Subjective:     Patient ID: Breanna Byrd, female    DOB: 1981-08-28, 42 y.o.   MRN: [id number]  Chief Complaint  Patient presents with   Medical Management of Chronic Issues    3 month follow-up  Last week had pain in back and stomach Fasting     HPI Pre DM - Complaint with metformin 500 mg once daily. States she walks every other day and has been trying to follow a healthy diet .   Anemia - Is taking ferrous sulfate 325 mg BID-alt days. Reports heavy periods but better w/IUD, but no irregular vaginal bleeding when not on her cycle. Saw gyn and IUD placed in Oct   energy better.  Vitamin B12 Deficiency - was getting monthly injections Pt is vegetarian  Vitamin D Deficiency - Reports she is taking D weekly  5.  Baker Pierini been working on diet/exercise.  When she saw the cardiologist in April, he prescribed Wegovy, but it was not approved by her insurance until she has done 3 months of diet/exercise.  She did see a nutritionist in June.  Has struggled a little bit with exercise  6.  HLD-has been working on diet.  Some exercise.   but is now on crestor 10mg  daily 7.  Still some low back pain at times. One day on cruise, pain mid epi and back.  Ibu helped.   There are no preventive care reminders to display for this patient.   Past Medical History:  Diagnosis Date   B12 deficiency    Cyst of ovary 05/2023   large   Cyst of right ovary 06/07/2023   Dyspnea    Fatty liver    History of syncope 03/14/2023   ED visit in epic;  neurology evalution by dr dohmeier in epic 03-22-2023 w/ normal EEG;   cardiology evaluation by dr Jacinto Halim 04-20-2023  w/ normal ETT, echo, and event monitor   Hyperlipidemia, mixed    IDA (iron deficiency anemia)    Nephrolithiasis 2022   per imaging in epic bilateral nonobstructive  (06-01-2023  that she know's of they have not passed)   Pelvic pain in female 05/2023   from large ovarian cyst   Pre-diabetes    Vitamin D deficiency    Wears  contact lenses     Past Surgical History:  Procedure Laterality Date   COLONOSCOPY W/ POLYPECTOMY  08/26/2022   dr Barron Alvine   LAPAROSCOPIC APPENDECTOMY N/A 10/08/2022   Procedure: APPENDECTOMY LAPAROSCOPIC;  Surgeon: Almond Lint, MD;  Location: [facility];  Service: General;  Laterality: N/A;   LAPAROSCOPIC BILATERAL SALPINGO OOPHERECTOMY Bilateral 06/07/2023   Procedure: LAPAROSCOPIC BILATERAL SALPINGECTOMY, Right OOPHORECTOMY;  Surgeon: Shea Evans, MD;  Location: [facility];  Service: Gynecology;  Laterality: Bilateral;   LAPAROSCOPIC OVARIAN CYSTECTOMY Right 06/07/2023   Procedure: LAPAROSCOPIC Right OVARIAN CYSTECTOMY/Pelvic Washings;  Surgeon: Shea Evans, MD;  Location: [facility];  Service: Gynecology;  Laterality: Right;  Requests 2hrs.   UMBILICAL HERNIA REPAIR  10/08/2022   Procedure: HERNIA REPAIR UMBILICAL ADULT;  Surgeon: Almond Lint, MD;  Location: [facility] OR;  Service: General;;     Current Outpatient Medications:    acetaminophen (TYLENOL) 500 MG tablet, Take 1 tablet (500 mg total) by mouth every 6 (six) hours as needed., Disp: 30 tablet, Rfl: 0   ferrous sulfate 325 (65 FE) MG EC tablet, Take 325 mg by mouth 2 (two) times daily., Disp: , Rfl:    levonorgestrel (MIRENA, 52 MG,) 20 MCG/DAY IUD,  Take 1 device by intrauterine route., Disp: , Rfl:    rosuvastatin (CRESTOR) 10 MG tablet, TAKE 1 TABLET BY MOUTH EVERY DAY, Disp: 90 tablet, Rfl: 0   Vitamin D, Ergocalciferol, (DRISDOL) 1.25 MG (50000 UNIT) CAPS capsule, twice weekly for 4 wks, then weekly for 2 more months. (Patient taking differently: Take 50,000 Units by mouth every 7 (seven) days. twice weekly for 4 wks, then weekly for 2 more months./   weekly on Tuesday's), Disp: 14 capsule, Rfl: 3   metFORMIN (GLUCOPHAGE) 500 MG tablet, Take 1 tablet (500 mg total) by mouth daily with breakfast., Disp: 90 tablet, Rfl: 1  Current Facility-Administered Medications:    cyanocobalamin (VITAMIN  B12) injection 1,000 mcg, 1,000 mcg, Intramuscular, Once, Jeani Sow, MD  No Known Allergies ROS neg/noncontributory except as noted HPI/below      Objective:     BP 124/78   Pulse 76   Temp (!) 97.5 F (36.4 C) (Temporal)   Resp 18   Ht 5\' 4"  (1.626 m)   Wt 229 lb 8 oz (104.1 kg)   SpO2 97%   BMI 39.39 kg/m  Wt Readings from Last 3 Encounters:  12/17/23 229 lb 8 oz (104.1 kg)  11/24/23 226 lb 4 oz (102.6 kg)  09/01/23 221 lb 4 oz (100.4 kg)    Physical Exam   Gen: WDWN NAD HEENT: NCAT, conjunctiva not injected, sclera nonicteric NECK:  supple, no thyromegaly, no nodes, no carotid bruits CARDIAC: RRR, S1S2+, no murmur. DP 2+B LUNGS: CTAB. No wheezes ABDOMEN:  BS+, soft, NTND, No HSM, no masses EXT:  no edema MSK: no gross abnormalities.  NEURO: A&O x3.  CN II-XII intact.  PSYCH: normal mood. Good eye contact     Assessment & Plan:  B12 deficiency Assessment & Plan: Chronic.  Not controlled.  She is vegetarian.  Patient is not great at taking meds, so she would prefer to come in for monthly injections.  Orders: -     Cyanocobalamin -     Vitamin B12  Prediabetes Assessment & Plan: Chronic.  good control.  She has been working some on diet exercise.  Has not lost any weight.   Continue metformin 500 mg daily  Orders: -     Hemoglobin A1c -     metFORMIN HCl; Take 1 tablet (500 mg total) by mouth daily with breakfast.  Dispense: 90 tablet; Refill: 1  Hyperlipidemia, mixed Assessment & Plan: Chronic.  Not controlled.  She does have a family history of coronary artery disease.  Cardiologist recommended taking a statin.  On crestor 10mg  daily now  Orders: -     Comprehensive metabolic panel -     Lipid panel  Vitamin D deficiency Assessment & Plan: Chronic.  Suspect from dietary deficiency.  Taking D 50k weekly now  Orders: -     VITAMIN D 25 Hydroxy (Vit-D Deficiency, Fractures)  Iron deficiency anemia secondary to inadequate dietary iron  intake Assessment & Plan: Chronic.  She does have some associated anemia from this.  Does have some heavy menses but just got IUD and less bleeding.  Patient has been taking her iron supplements occ.  Will check labs  Orders: -     CBC with Differential/Platelet -     IBC + Ferritin   Obesity-patient has mixed hyperlipidemia, prediabetes.  Also, family history coronary artery disease.  Has been working on diet but needs to work on exercise. Not losing any weight.  She is considering 23.  She needed to do a trial of 3 months of diet/exercise (which she has done) she is still considering taking the medication.  She will let us know what she decides.  Return in about 3 months (around 03/16/2024) for cholesterol, pre DM.    monthly nurse for B12.      Angelena Sole, MD

## 2023-12-17 NOTE — Assessment & Plan Note (Signed)
Chronic.  Not controlled.  She is vegetarian.  Patient is not great at taking meds, so she would prefer to come in for monthly injections.

## 2023-12-19 NOTE — Progress Notes (Signed)
1.  Cholesterol still too high-increase crestor to 20mg  daily-send rx for #90/0 2.  Vitamin D too low-renew rx for weekly #12/3 3.  B12 really low, make sure does injections monthly 4.  Iron stores are still very low-take the iron at least every other day.  Hopefully will improve since bleeding has improved 5.  A1C worse-make sure taking the metformin and work more on diet/exercise

## 2023-12-20 ENCOUNTER — Other Ambulatory Visit: Payer: Self-pay | Admitting: *Deleted

## 2023-12-20 MED ORDER — VITAMIN D (ERGOCALCIFEROL) 1.25 MG (50000 UNIT) PO CAPS: 50000.0000 [IU] | ORAL_CAPSULE | ORAL | 3 refills | Status: DC

## 2023-12-20 MED ORDER — ROSUVASTATIN CALCIUM 20 MG PO TABS: 20.0000 mg | ORAL_TABLET | Freq: Every day | ORAL | 0 refills | Status: DC

## 2024-01-10 ENCOUNTER — Encounter: Payer: Self-pay | Admitting: Family Medicine

## 2024-01-10 ENCOUNTER — Telehealth: Payer: Self-pay | Admitting: *Deleted

## 2024-01-10 NOTE — Telephone Encounter (Signed)
Spoke to patient and she stated that she would send a mychart message concerning back pain with details of when it happened, how long it lasted, what she was doing when it happened, etc.

## 2024-01-10 NOTE — Telephone Encounter (Signed)
Left message to return call to office.   Copied from CRM [id number]. Topic: Clinical - Medical Advice >> Jan 10, 2024 12:50 PM Almira Coaster wrote: Reason for CRM: Patient would like to speak with Dr.Kulik's nurse regarding back pain she's been dealing with. Wanted to follow up on the concerns.

## 2024-01-18 ENCOUNTER — Ambulatory Visit (INDEPENDENT_AMBULATORY_CARE_PROVIDER_SITE_OTHER): Payer: BC Managed Care – PPO

## 2024-01-18 DIAGNOSIS — E538 Deficiency of other specified B group vitamins: Secondary | ICD-10-CM | POA: Diagnosis not present

## 2024-01-18 MED ORDER — CYANOCOBALAMIN 1000 MCG/ML IJ SOLN
1000.0000 ug | Freq: Once | INTRAMUSCULAR | Status: AC
  Administered 2024-01-18: 1000 ug via INTRAMUSCULAR

## 2024-01-18 NOTE — Progress Notes (Signed)
.  Patient is in office today for a nurse visit for B12 Injection Per Dr. Ruthine Dose. Patient Injection was given in the  Right deltoid. Patient tolerated injection well.   Christy Gentles

## 2024-02-02 ENCOUNTER — Ambulatory Visit (INDEPENDENT_AMBULATORY_CARE_PROVIDER_SITE_OTHER): Payer: BC Managed Care – PPO | Admitting: Family Medicine

## 2024-02-02 ENCOUNTER — Encounter: Payer: Self-pay | Admitting: Family Medicine

## 2024-02-02 ENCOUNTER — Ambulatory Visit (INDEPENDENT_AMBULATORY_CARE_PROVIDER_SITE_OTHER): Payer: BC Managed Care – PPO

## 2024-02-02 VITALS — BP 118/82 | HR 67 | Ht 64.0 in | Wt 228.0 lb

## 2024-02-02 DIAGNOSIS — M545 Low back pain, unspecified: Secondary | ICD-10-CM

## 2024-02-02 DIAGNOSIS — M47816 Spondylosis without myelopathy or radiculopathy, lumbar region: Secondary | ICD-10-CM | POA: Diagnosis not present

## 2024-02-02 DIAGNOSIS — G8929 Other chronic pain: Secondary | ICD-10-CM | POA: Diagnosis not present

## 2024-02-02 NOTE — Progress Notes (Signed)
   I, Stevenson Clinch, CMA acting as a scribe for Clementeen Graham, MD.  Breanna Byrd is a 43 y.o. female who presents to [facility] today for back pain x  weeks, off an on. Pt locates pain to left side lower back. Denies radiating pain. IBU prn for more severe pain.   Radiating pain: no LE numbness/tingling: no LE weakness: no Aggravates: unknown Treatments tried: heat, ice, IBU  Pertinent review of systems: No fevers or chills  Relevant historical information: Obesity and prediabetes   Exam:  BP 118/82   Pulse 67   Ht 5\' 4"  (1.626 m)   Wt 228 lb (103.4 kg)   SpO2 100%   BMI 39.14 kg/m  General: Well Developed, well nourished, and in no acute distress.   MSK: L-spine nontender palpation midline.  Tender palpation left lumbar paraspinal musculature.  Decreased lumbar motion lower extremity strength is intact. Reflexes are intact.    Lab and Radiology Results  X-ray images lumbar spine obtained today personally and independently interpreted. No acute fractures.  No severe degenerative changes. Await formal radiology review     Assessment and Plan: 43 y.o. female with chronic left low back pain ongoing since October or November 2024.  Pain is intermittent and hard to reproduce but located predominantly in the left low back.  Plan for trial of physical therapy for muscle spasm and dysfunction along the lumbar paraspinal or multifidus muscles. Recheck in about 8 weeks.   PDMP not reviewed this encounter. Orders Placed This Encounter  Procedures   DG Lumbar Spine 2-3 Views    Standing Status:   Future    Number of Occurrences:   1    Expiration Date:   03/01/2024    Reason for Exam (SYMPTOM  OR DIAGNOSIS REQUIRED):   left-sided lower back pain    Preferred imaging location?:   [facility] [place] [facility]    Is patient pregnant?:   No   Ambulatory referral to Physical Therapy    Referral Priority:   Routine    Referral Type:   Physical  Medicine    Referral Reason:   Specialty Services Required    Requested Specialty:   Physical Therapy    Number of Visits Requested:   1   No orders of the defined types were placed in this encounter.    Discussed warning signs or symptoms. Please see discharge instructions. Patient expresses understanding.   The above documentation has been reviewed and is accurate and complete Clementeen Graham, M.D.

## 2024-02-02 NOTE — Patient Instructions (Signed)
TENS UNIT: This is helpful for muscle pain and spasm.   Search and Purchase a TENS 7000 2nd edition at  www.tenspros.com or www.Amazon.com It should be less than $30.     TENS unit instructions: Do not shower or bathe with the unit on Turn the unit off before removing electrodes or batteries If the electrodes lose stickiness add a drop of water to the electrodes after they are disconnected from the unit and place on plastic sheet. If you continued to have difficulty, call the TENS unit company to purchase more electrodes. Do not apply lotion on the skin area prior to use. Make sure the skin is clean and dry as this will help prolong the life of the electrodes. After use, always check skin for unusual red areas, rash or other skin difficulties. If there are any skin problems, does not apply electrodes to the same area. Never remove the electrodes from the unit by pulling the wires. Do not use the TENS unit or electrodes other than as directed. Do not change electrode placement without consultating your therapist or physician. Keep 2 fingers with between each electrode. Wear time ratio is 2:1, on to off times.    For example on for 30 minutes off for 15 minutes and then on for 30 minutes off for 15 minutes

## 2024-02-11 ENCOUNTER — Encounter: Payer: Self-pay | Admitting: Family Medicine

## 2024-02-11 NOTE — Progress Notes (Signed)
 Low back x-ray shows mild arthritis changes.

## 2024-02-14 ENCOUNTER — Ambulatory Visit (INDEPENDENT_AMBULATORY_CARE_PROVIDER_SITE_OTHER): Payer: BC Managed Care – PPO | Admitting: Physical Therapy

## 2024-02-14 ENCOUNTER — Encounter: Payer: Self-pay | Admitting: Physical Therapy

## 2024-02-14 DIAGNOSIS — M5459 Other low back pain: Secondary | ICD-10-CM

## 2024-02-14 NOTE — Therapy (Signed)
 OUTPATIENT PHYSICAL THERAPY LOWER EXTREMITY EVALUATION   Patient Name: Breanna Byrd MRN: [id number] DOB:October 16, 1981, 43 y.o., female Today's Date: 02/14/2024  END OF SESSION:  PT End of Session - 02/14/24 1044     Visit Number 1    Number of Visits 16    Date for PT Re-Evaluation 04/10/24    Authorization Type [employer]    PT Start Time 0812    PT Stop Time 0845    PT Time Calculation (min) 33 min    Activity Tolerance Patient tolerated treatment well    Behavior During Therapy [facility] for tasks assessed/performed             Past Medical History:  Diagnosis Date   B12 deficiency    Cyst of ovary 05/2023   large   Cyst of right ovary 06/07/2023   Dyspnea    Fatty liver    History of syncope 03/14/2023   ED visit in epic;  neurology evalution by dr dohmeier in epic 03-22-2023 w/ normal EEG;   cardiology evaluation by dr Jacinto Halim 04-20-2023  w/ normal ETT, echo, and event monitor   Hyperlipidemia, mixed    IDA (iron deficiency anemia)    Nephrolithiasis 2022   per imaging in epic bilateral nonobstructive  (06-01-2023  that she know's of they have not passed)   Pelvic pain in female 05/2023   from large ovarian cyst   Pre-diabetes    Vitamin D deficiency    Wears contact lenses    Past Surgical History:  Procedure Laterality Date   COLONOSCOPY W/ POLYPECTOMY  08/26/2022   dr Barron Alvine   LAPAROSCOPIC APPENDECTOMY N/A 10/08/2022   Procedure: APPENDECTOMY LAPAROSCOPIC;  Surgeon: Almond Lint, MD;  Location: [facility] OR;  Service: General;  Laterality: N/A;   LAPAROSCOPIC BILATERAL SALPINGO OOPHERECTOMY Bilateral 06/07/2023   Procedure: LAPAROSCOPIC BILATERAL SALPINGECTOMY, Right OOPHORECTOMY;  Surgeon: Shea Evans, MD;  Location: [facility];  Service: Gynecology;  Laterality: Bilateral;   LAPAROSCOPIC OVARIAN CYSTECTOMY Right 06/07/2023   Procedure: LAPAROSCOPIC Right OVARIAN CYSTECTOMY/Pelvic Washings;  Surgeon: Shea Evans, MD;  Location[facility];  Service: Gynecology;  Laterality: Right;  Requests 2hrs.   UMBILICAL HERNIA REPAIR  10/08/2022   Procedure: HERNIA REPAIR UMBILICAL ADULT;  Surgeon: Almond Lint, MD;  Location: [facility] OR;  Service: General;;   Patient Active Problem List   Diagnosis Date Noted   Cyst of right ovary 06/07/2023   Syncope 03/22/2023   Sensation of chest pressure 03/22/2023   SOB (shortness of breath) on exertion 03/22/2023   Iron deficiency anemia secondary to inadequate dietary iron intake 03/22/2023   Dehydration after exertion 03/22/2023   Prediabetes 09/01/2022   ASCUS of cervix with negative high risk HPV 11/29/2020   Hyperlipidemia, mixed 08/23/2020   B12 deficiency 08/23/2020   Vitamin D deficiency 08/23/2020     PCP: Jeani Sow  REFERRING PROVIDER: Clementeen Graham  REFERRING DIAG: Low back pain  THERAPY DIAG:  Other low back pain  Rationale for Evaluation and Treatment: Rehabilitation  ONSET DATE: Sept/Oct 2024.    SUBJECTIVE:   SUBJECTIVE STATEMENT: Pt states in Sept/Oct she had increased pain for about 15 days. No incident to report. States increased pain last night when she was trying to go to sleep. Pain very  intermittent , does have some pain free days.  Had appendecomy and umbilical hernia repair last year, states good recovery from this. Has had kidney stones in past, this pain does not feel like that, but pt states  pain feels more "inside" vs muscle soreness outside.  No into glute,or into leg. No radicular pain.  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 Does have office/swivel chair.  States Bil pl fascia pain, newer, pain in both arches.  Will bring other shoes to look at  next visit. Wearing very flexible sketchers today.    PERTINENT HISTORY: LAPAROSCOPIC APPENDECTOMY, UMBILICAL HERNIA REPAIR   PAIN:  Are you having pain? Yes: NPRS scale: 7-8/10 Pain location: L low back Pain description:  intermittent, sore, deep Aggravating factors: pt unable to state Relieving factors: heat,   PRECAUTIONS: None  WEIGHT BEARING RESTRICTIONS: No  FALLS:  Has patient fallen in last 6 months? No   PLOF: Independent  PATIENT GOALS:  Decreased pain in back  NEXT MD VISIT:   OBJECTIVE:   DIAGNOSTIC FINDINGS:   PATIENT SURVEYS:  will do next visit   Patient-Specific Activity Scoring Scheme  "0" represents "unable to perform." "10" represents "able to perform at prior level. 0 1 2 3 4 5 6 7 8 9  10 (Date and Score)   Activity Eval     1.       2.       3.     4.    5.     Total score = sum of the activity scores/number of activities Minimum detectable change (90%CI) for average score = 2 points Minimum detectable change (90%CI) for single activity score = 3 points  Score:   COGNITION: Overall cognitive status: Within functional limits for tasks assessed     SENSATION: WFL  EDEMA:    POSTURE:  mod/significant LE valgus, bil pronation in feet.    PALPATION:  mild tenderness in L QL, no pain to palpate in thoracic or lumbar spine.   Tenderness in bil pl fascia   LOWER EXTREMITY ROM: Lumbar: WFL no pain Hips: WFL  LOWER EXTREMITY MMT:  MMT Left eval Right  eval  Hip flexion 4- 4-  Hip extension    Hip abduction 4+ 4+  Hip adduction    Hip internal rotation    Hip external rotation    Knee flexion 5 5  Knee extension 5 5  Ankle dorsiflexion    Ankle plantarflexion    Ankle inversion    Ankle eversion     (Blank rows = not tested)  LOWER EXTREMITY SPECIAL TESTS:  Neg SLR   FUNCTIONAL TESTS:    GAIT:    TODAY'S TREATMENT:                                                                                                                              DATE:  02/14/2024  Therapeutic Exercise: Aerobic: Supine: Seated: Standing: Stretches:   LTR x 15;  childs pose L, R center x 3 ea; seated HSS 30 sec x 2 bil;  Neuromuscular  Re-education: Manual Therapy: Therapeutic Activity:  Self Care:    PATIENT EDUCATION:  Education details: PT POC, Exam findings, HEP Person educated: Patient Education method: Explanation, Demonstration, Tactile cues, Verbal cues, and Handouts Education comprehension: verbalized understanding, returned demonstration, verbal cues required, tactile cues required, and needs further education   HOME EXERCISE PROGRAM: Access Code: [email or web address] URL: [email or web address] Date: 02/14/2024 Prepared by: Sedalia Muta  Exercises - Supine Lower Trunk Rotation  - 2 x daily - 10 reps - 5 hold - Child's Pose with Sidebending  - 2 x daily - 3 reps - 30 hold - Seated Hamstring Stretch  - 2 x daily - 3 reps - 30 hold  ASSESSMENT:  CLINICAL IMPRESSION: Patient presents with primary complaint of  pain in L side of low back. She has pain likely stemming from muscle tension, core weakness, and improper workstation set up. Pt with decreased strength of hips and core, and poor movement mechanics, as well as lack of effective HEP. She also has increased pain in bil feet consistent with pl fascia. Pt with decreased ability for full functional activities. Pt will  benefit from skilled PT to improve deficits and pain and to return to PLOF.   OBJECTIVE IMPAIRMENTS: decreased activity tolerance, decreased mobility, decreased strength, increased muscle spasms, improper body mechanics, and pain.   ACTIVITY LIMITATIONS: carrying, lifting, bending, standing, sleeping, stairs, and locomotion level  PARTICIPATION LIMITATIONS: meal prep, cleaning, laundry, driving, shopping, and community activity  PERSONAL FACTORS: Time since onset of injury/illness/exacerbation are also affecting patient's functional outcome.   REHAB POTENTIAL: Good  CLINICAL DECISION MAKING: Stable/uncomplicated  EVALUATION COMPLEXITY: Low   GOALS: Goals reviewed with patient? Yes  SHORT TERM GOALS: Target date:  02/28/2024  Pt to be independent with initial HEP  Goal status: INITIAL  2.  Pt to reports optimal work station set up for work duties.   Goal status: INITIAL    LONG TERM GOALS: Target date: 04/10/2024  Pt to be independent with final HEP  Goal status: INITIAL  2.  Pt to report improved symptoms  by at least 75 %  Goal status: INITIAL  3.  Pt to report decreased pain in back to 0-4/10 with work duties and standing/IADLS.   Goal status: INITIAL  4.  Pt to demo optimal mechanics with bend, lift, squat for improved pain with IADLs.   Goal status: INITIAL    PLAN:  PT FREQUENCY: 1-2x/week  PT DURATION: 8 weeks  PLANNED INTERVENTIONS: Therapeutic exercises, Therapeutic activity, Neuromuscular re-education, Patient/Family education, Self Care, Joint mobilization, Joint manipulation, Stair training, Orthotic/Fit training, DME instructions, Aquatic Therapy, Dry Needling, Electrical stimulation, Cryotherapy, Moist heat, Taping, Ultrasound, Ionotophoresis 4mg /ml Dexamethasone, Manual therapy,  Vasopneumatic device, Traction, Spinal manipulation, Spinal mobilization,Balance training, Gait training,   PLAN FOR NEXT SESSION: review workstation set up, L sided pain in back/manual, mobility, work into core stabilization   Sedalia Muta, PT, DPT 11:00 AM  02/14/24

## 2024-02-23 ENCOUNTER — Encounter: Payer: Self-pay | Admitting: Physical Therapy

## 2024-02-23 ENCOUNTER — Ambulatory Visit (INDEPENDENT_AMBULATORY_CARE_PROVIDER_SITE_OTHER): Payer: BC Managed Care – PPO | Admitting: Physical Therapy

## 2024-02-23 DIAGNOSIS — M5459 Other low back pain: Secondary | ICD-10-CM

## 2024-02-23 NOTE — Therapy (Signed)
 OUTPATIENT PHYSICAL THERAPY LOWER EXTREMITY TREATMENT    Patient Name: Breanna Byrd MRN: [id number] DOB:31-May-1981, 43 y.o., female Today's Date: 02/23/2024  END OF SESSION:  PT End of Session - 02/23/24 0853     Visit Number 2    Number of Visits 16    Date for PT Re-Evaluation 04/10/24    Authorization Type BCBS    PT Start Time 0855    PT Stop Time 0928    PT Time Calculation (min) 33 min    Activity Tolerance Patient tolerated treatment well    Behavior During Therapy [facility] for tasks assessed/performed             Past Medical History:  Diagnosis Date   B12 deficiency    Cyst of ovary 05/2023   large   Cyst of right ovary 06/07/2023   Dyspnea    Fatty liver    History of syncope 03/14/2023   ED visit in epic;  neurology evalution by dr dohmeier in epic 03-22-2023 w/ normal EEG;   cardiology evaluation by dr Jacinto Halim 04-20-2023  w/ normal ETT, echo, and event monitor   Hyperlipidemia, mixed    IDA (iron deficiency anemia)    Nephrolithiasis 2022   per imaging in epic bilateral nonobstructive  (06-01-2023  that she know's of they have not passed)   Pelvic pain in female 05/2023   from large ovarian cyst   Pre-diabetes    Vitamin D deficiency    Wears contact lenses    Past Surgical History:  Procedure Laterality Date   COLONOSCOPY W/ POLYPECTOMY  08/26/2022   dr Barron Alvine   LAPAROSCOPIC APPENDECTOMY N/A 10/08/2022   Procedure: APPENDECTOMY LAPAROSCOPIC;  Surgeon: Almond Lint, MD;  Location: [facility] OR;  Service: General;  Laterality: N/A;   LAPAROSCOPIC BILATERAL SALPINGO OOPHERECTOMY Bilateral 06/07/2023   Procedure: LAPAROSCOPIC BILATERAL SALPINGECTOMY, Right OOPHORECTOMY;  Surgeon: Shea Evans, MD;  Location: [facility];  Service: Gynecology;  Laterality: Bilateral;   LAPAROSCOPIC OVARIAN CYSTECTOMY Right 06/07/2023   Procedure: LAPAROSCOPIC Right OVARIAN CYSTECTOMY/Pelvic Washings;  Surgeon: Shea Evans, MD;  Location: [facility];  Service: Gynecology;  Laterality: Right;  Requests 2hrs.   UMBILICAL HERNIA REPAIR  10/08/2022   Procedure: HERNIA REPAIR UMBILICAL ADULT;  Surgeon: Almond Lint, MD;  Location: [facility] OR;  Service: General;;   Patient Active Problem List   Diagnosis Date Noted   Cyst of right ovary 06/07/2023   Syncope 03/22/2023   Sensation of chest pressure 03/22/2023   SOB (shortness of breath) on exertion 03/22/2023   Iron deficiency anemia secondary to inadequate dietary iron intake 03/22/2023   Dehydration after exertion 03/22/2023   Prediabetes 09/01/2022   ASCUS of cervix with negative high risk HPV 11/29/2020   Hyperlipidemia, mixed 08/23/2020   B12 deficiency 08/23/2020   Vitamin D deficiency 08/23/2020     PCP: Jeani Sow  REFERRING PROVIDER: Clementeen Graham  REFERRING DIAG: Low back pain  THERAPY DIAG:  Other low back pain  Rationale for Evaluation and Treatment: Rehabilitation  ONSET DATE: Sept/Oct 2024.    SUBJECTIVE:   SUBJECTIVE STATEMENT: Pt states no pain in back since last seen. She did alter her workstation for a more neutral set up.  Brought other sketchers sneakers to look at today. Feet still bothering her.   Eval: Pt states in Sept/Oct she had increased pain for about 15 days. No incident to report. States increased pain last night when she was trying to go to sleep. Pain very  intermittent ,  does have some pain free days.  Had appendecomy and umbilical hernia repair last year, states good recovery from this. Has had kidney stones in past, this pain does not feel like that, but pt states pain feels more "inside" vs muscle soreness outside.  No into glute,or into leg. No radicular pain.  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 Does have office/swivel chair.  States Bil pl fascia pain, newer, pain in both arches.  Will bring other shoes to look at  next visit. Wearing very flexible sketchers today.     PERTINENT HISTORY: LAPAROSCOPIC APPENDECTOMY, UMBILICAL HERNIA REPAIR   PAIN:  Are you having pain? Yes: NPRS scale: 0-2 /10 Pain location: L low back Pain description: intermittent, sore, deep Aggravating factors: pt unable to state Relieving factors: heat,   PRECAUTIONS: None  WEIGHT BEARING RESTRICTIONS: No  FALLS:  Has patient fallen in last 6 months? No   PLOF: Independent  PATIENT GOALS:  Decreased pain in back  NEXT MD VISIT:   OBJECTIVE:   DIAGNOSTIC FINDINGS:   PATIENT SURVEYS:  will do next visit   Patient-Specific Activity Scoring Scheme  "0" represents "unable to perform." "10" represents "able to perform at prior level. 0 1 2 3 4 5 6 7 8 9  10 (Date and Score)   Activity Eval     1.       2.       3.     4.    5.     Total score = sum of the activity scores/number of activities Minimum detectable change (90%CI) for average score = 2 points Minimum detectable change (90%CI) for single activity score = 3 points  Score:   COGNITION: Overall cognitive status: Within functional limits for tasks assessed     SENSATION: WFL  EDEMA:    POSTURE:  mod/significant LE valgus, bil pronation in feet.    PALPATION:  mild tenderness in L QL, no pain to palpate in thoracic or lumbar spine.   Tenderness in bil pl fascia   LOWER EXTREMITY ROM: Lumbar: WFL no pain Hips: WFL  LOWER EXTREMITY MMT:  MMT Left eval Right  eval  Hip flexion 4- 4-  Hip extension    Hip abduction 4+ 4+  Hip adduction    Hip internal rotation    Hip external rotation    Knee flexion 5 5  Knee extension 5 5  Ankle dorsiflexion    Ankle plantarflexion    Ankle inversion    Ankle eversion     (Blank rows = not tested)  LOWER EXTREMITY SPECIAL TESTS:  Neg SLR   FUNCTIONAL TESTS:    GAIT:    TODAY'S TREATMENT:                                                                                                                              DATE:  02/23/2024 Therapeutic Exercise: Aerobic: Supine:  pelvic tilts x 10;   TA x 10education for achieving contraction , with breathing x 10;  SLR with TA 2 x 10 bil;  Seated: Standing:  hip abd 2 x 10 bil- cueing for decreasing trunk compensation.  Stretches:   LTR x 15;   seated HSS 30 sec x 2 bil;  Neuromuscular Re-education: Manual Therapy: Therapeutic Activity: Self Care: education on foot position, neutral alignment and optimal footwear, sizing, to improve foot pain.    PATIENT EDUCATION:  Education details: PT POC, Exam findings, HEP Person educated: Patient Education method: Explanation, Demonstration, Tactile cues, Verbal cues, and Handouts Education comprehension: verbalized understanding, returned demonstration, verbal cues required, tactile cues required, and needs further education   HOME EXERCISE PROGRAM: Access Code: [email or web address] URL: [email or web address] Date: 02/14/2024 Prepared by: Sedalia Muta  Exercises - Supine Lower Trunk Rotation  - 2 x daily - 10 reps - 5 hold - Child's Pose with Sidebending  - 2 x daily - 3 reps - 30 hold - Seated Hamstring Stretch  - 2 x daily - 3 reps - 30 hold  ASSESSMENT:  CLINICAL IMPRESSION: Pt late to appt today. with less pain in back. 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 She is challenged with core and hip stability, with much trunk compensation seen in standing with hip abd today. She will benefit from continued education and practice with core stability. She is still having foot pain, current footwear too small and too flexible, discussed options for updated footwear with recommendations given today.   Eval: Patient presents with primary complaint of  pain in L side of low back. She has pain likely stemming from muscle tension, core weakness, and improper workstation set up. Pt with decreased strength of hips and core, and poor movement mechanics, as well as lack of effective HEP. She also has  increased pain in bil feet consistent with pl fascia. Pt with decreased ability for full functional activities. Pt will  benefit from skilled PT to improve deficits and pain and to return to PLOF.   OBJECTIVE IMPAIRMENTS: decreased activity tolerance, decreased mobility, decreased strength, increased muscle spasms, improper body mechanics, and pain.   ACTIVITY LIMITATIONS: carrying, lifting, bending, standing, sleeping, stairs, and locomotion level  PARTICIPATION LIMITATIONS: meal prep, cleaning, laundry, driving, shopping, and community activity  PERSONAL FACTORS: Time since onset of injury/illness/exacerbation are also affecting patient's functional outcome.   REHAB POTENTIAL: Good  CLINICAL DECISION MAKING: Stable/uncomplicated  EVALUATION COMPLEXITY: Low   GOALS: Goals reviewed with patient? Yes  SHORT TERM GOALS: Target date: 02/28/2024  Pt to be independent with initial HEP  Goal status: INITIAL  2.  Pt to reports optimal work station set up for work duties.   Goal status: INITIAL    LONG TERM GOALS: Target date: 04/10/2024  Pt to be independent with final HEP  Goal status: INITIAL  2.  Pt to report improved symptoms  by at least 75 %  Goal status: INITIAL  3.  Pt to report decreased pain in back to 0-4/10 with work duties and standing/IADLS.   Goal status: INITIAL  4.  Pt to demo optimal mechanics with bend, lift, squat for improved pain with IADLs.   Goal status: INITIAL    PLAN:  PT FREQUENCY: 1-2x/week  PT DURATION: 8 weeks  PLANNED INTERVENTIONS: Therapeutic exercises, Therapeutic activity, Neuromuscular re-education, Patient/Family education, Self Care, Joint mobilization, Joint manipulation, Stair training, Orthotic/Fit training, DME instructions, Aquatic Therapy, Dry Needling,  Electrical stimulation, Cryotherapy, Moist heat, Taping, Ultrasound, Ionotophoresis 4mg /ml Dexamethasone, Manual therapy,  Vasopneumatic device, Traction, Spinal  manipulation, Spinal mobilization,Balance training, Gait training,   PLAN FOR NEXT SESSION: review workstation set up, L sided pain in back/manual, mobility, work into core stabilization,  bridging    Sedalia Muta, PT, DPT 9:29 AM  02/23/24

## 2024-03-01 ENCOUNTER — Encounter: Payer: Self-pay | Admitting: Physical Therapy

## 2024-03-01 ENCOUNTER — Ambulatory Visit: Payer: BC Managed Care – PPO | Admitting: Physical Therapy

## 2024-03-01 DIAGNOSIS — M5459 Other low back pain: Secondary | ICD-10-CM | POA: Diagnosis not present

## 2024-03-01 NOTE — Therapy (Unsigned)
 OUTPATIENT PHYSICAL THERAPY LOWER EXTREMITY TREATMENT    Patient Name: Breanna Byrd MRN: [id number] DOB:08-10-1981, 43 y.o., female Today's Date: 03/01/2024  END OF SESSION:  PT End of Session - 03/01/24 0814     Visit Number 3    Number of Visits 16    Date for PT Re-Evaluation 04/10/24    Authorization Type BCBS    PT Start Time 0809    PT Stop Time 0847    PT Time Calculation (min) 38 min    Activity Tolerance Patient tolerated treatment well    Behavior During Therapy [facility] for tasks assessed/performed              Past Medical History:  Diagnosis Date   B12 deficiency    Cyst of ovary 05/2023   large   Cyst of right ovary 06/07/2023   Dyspnea    Fatty liver    History of syncope 03/14/2023   ED visit in epic;  neurology evalution by dr dohmeier in epic 03-22-2023 w/ normal EEG;   cardiology evaluation by dr Jacinto Halim 04-20-2023  w/ normal ETT, echo, and event monitor   Hyperlipidemia, mixed    IDA (iron deficiency anemia)    Nephrolithiasis 2022   per imaging in epic bilateral nonobstructive  (06-01-2023  that she know's of they have not passed)   Pelvic pain in female 05/2023   from large ovarian cyst   Pre-diabetes    Vitamin D deficiency    Wears contact lenses    Past Surgical History:  Procedure Laterality Date   COLONOSCOPY W/ POLYPECTOMY  08/26/2022   dr Barron Alvine   LAPAROSCOPIC APPENDECTOMY N/A 10/08/2022   Procedure: APPENDECTOMY LAPAROSCOPIC;  Surgeon: Almond Lint, MD;  Location: [facility] OR;  Service: General;  Laterality: N/A;   LAPAROSCOPIC BILATERAL SALPINGO OOPHERECTOMY Bilateral 06/07/2023   Procedure: LAPAROSCOPIC BILATERAL SALPINGECTOMY, Right OOPHORECTOMY;  Surgeon: Shea Evans, MD;  Location: [facility];  Service: Gynecology;  Laterality: Bilateral;   LAPAROSCOPIC OVARIAN CYSTECTOMY Right 06/07/2023   Procedure: LAPAROSCOPIC Right OVARIAN CYSTECTOMY/Pelvic Washings;  Surgeon: Shea Evans, MD;  Location: [facility];  Service: Gynecology;  Laterality: Right;  Requests 2hrs.   UMBILICAL HERNIA REPAIR  10/08/2022   Procedure: HERNIA REPAIR UMBILICAL ADULT;  Surgeon: Almond Lint, MD;  Location: [facility] OR;  Service: General;;   Patient Active Problem List   Diagnosis Date Noted   Cyst of right ovary 06/07/2023   Syncope 03/22/2023   Sensation of chest pressure 03/22/2023   SOB (shortness of breath) on exertion 03/22/2023   Iron deficiency anemia secondary to inadequate dietary iron intake 03/22/2023   Dehydration after exertion 03/22/2023   Prediabetes 09/01/2022   ASCUS of cervix with negative high risk HPV 11/29/2020   Hyperlipidemia, mixed 08/23/2020   B12 deficiency 08/23/2020   Vitamin D deficiency 08/23/2020     PCP: Jeani Sow  REFERRING PROVIDER: Clementeen Graham  REFERRING DIAG: Low back pain  THERAPY DIAG:  Other low back pain  Rationale for Evaluation and Treatment: Rehabilitation  ONSET DATE: Sept/Oct 2024.    SUBJECTIVE:   SUBJECTIVE STATEMENT: Pt states pain still doing much better. Felt it just a little bit yesterday. Has been doing HEP. Foot pain has been better, with walking in different more supportive shoes.   Eval: Pt states in Sept/Oct she had increased pain for about 15 days. No incident to report. States increased pain last night when she was trying to go to sleep. Pain very  intermittent , does  have some pain free days.  Had appendecomy and umbilical hernia repair last year, states good recovery from this. Has had kidney stones in past, this pain does not feel like that, but pt states pain feels more "inside" vs muscle soreness outside.  No into glute,or into leg. No radicular pain.  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 Does have office/swivel chair.  States Bil pl fascia pain, newer, pain in both arches.  Will bring other shoes to look at  next visit. Wearing very flexible sketchers today.     PERTINENT HISTORY: LAPAROSCOPIC APPENDECTOMY, UMBILICAL HERNIA REPAIR   PAIN:  Are you having pain? Yes: NPRS scale: 0-2 /10 Pain location: L low back Pain description: intermittent, sore, deep Aggravating factors: pt unable to state Relieving factors: heat,   PRECAUTIONS: None  WEIGHT BEARING RESTRICTIONS: No  FALLS:  Has patient fallen in last 6 months? No   PLOF: Independent  PATIENT GOALS:  Decreased pain in back  NEXT MD VISIT:   OBJECTIVE:   DIAGNOSTIC FINDINGS:   PATIENT SURVEYS:  will do next visit   Patient-Specific Activity Scoring Scheme  "0" represents "unable to perform." "10" represents "able to perform at prior level. 0 1 2 3 4 5 6 7 8 9  10 (Date and Score)   Activity Eval     1.       2.       3.     4.    5.     Total score = sum of the activity scores/number of activities Minimum detectable change (90%CI) for average score = 2 points Minimum detectable change (90%CI) for single activity score = 3 points  Score:   COGNITION: Overall cognitive status: Within functional limits for tasks assessed     SENSATION: WFL  EDEMA:    POSTURE:  mod/significant LE valgus, bil pronation in feet.    PALPATION:  mild tenderness in L QL, no pain to palpate in thoracic or lumbar spine.   Tenderness in bil pl fascia   LOWER EXTREMITY ROM: Lumbar: WFL no pain Hips: WFL  LOWER EXTREMITY MMT:  MMT Left eval Right  eval  Hip flexion 4- 4-  Hip extension    Hip abduction 4+ 4+  Hip adduction    Hip internal rotation    Hip external rotation    Knee flexion 5 5  Knee extension 5 5  Ankle dorsiflexion    Ankle plantarflexion    Ankle inversion    Ankle eversion     (Blank rows = not tested)  LOWER EXTREMITY SPECIAL TESTS:  Neg SLR   FUNCTIONAL TESTS:    GAIT:    TODAY'S TREATMENT:                                                                                                                              DATE:  03/01/2024 Therapeutic Exercise: Aerobic: Supine:  pelvic tilts x 10;   TA x 10 education for achieving contraction   SLR with TA 2 x 10 bil;    bridging 2 x 10;   Seated: Standing:  hip abd 2 x 10 bil Rows  GTB x 20;   Stretches:   LTR x 15;   Neuromuscular Re-education: Manual Therapy: Therapeutic Activity: Self Care:       Therapeutic Exercise: Aerobic: Supine:  pelvic tilts x 10;   TA x 10education for achieving contraction , with breathing x 10;  SLR with TA 2 x 10 bil;  Seated: Standing:  hip abd 2 x 10 bil- cueing for decreasing trunk compensation.  Stretches:   LTR x 15;   seated HSS 30 sec x 2 bil;  Neuromuscular Re-education: Manual Therapy: Therapeutic Activity: Self Care: education on foot position, neutral alignment and optimal footwear, sizing, to improve foot pain.    PATIENT EDUCATION:  Education details: PT POC, Exam findings, HEP Person educated: Patient Education method: Explanation, Demonstration, Tactile cues, Verbal cues, and Handouts Education comprehension: verbalized understanding, returned demonstration, verbal cues required, tactile cues required, and needs further education   HOME EXERCISE PROGRAM: Access Code: [email or web address]   ASSESSMENT:  CLINICAL IMPRESSION: Pt late to appt today. She is progressing well with less pain in back. Focus for hip and core strength and stabilization today.  She will benefit from continued education and practice with core stability.   Eval: Patient presents with primary complaint of  pain in L side of low back. She has pain likely stemming from muscle tension, core weakness, and improper workstation set up. Pt with decreased strength of hips and core, and poor movement mechanics, as well as lack of effective HEP. She also has increased pain in bil feet consistent with pl fascia. Pt with decreased ability for full functional activities. Pt will  benefit from skilled PT to improve deficits and pain and to return to  PLOF.   OBJECTIVE IMPAIRMENTS: decreased activity tolerance, decreased mobility, decreased strength, increased muscle spasms, improper body mechanics, and pain.   ACTIVITY LIMITATIONS: carrying, lifting, bending, standing, sleeping, stairs, and locomotion level  PARTICIPATION LIMITATIONS: meal prep, cleaning, laundry, driving, shopping, and community activity  PERSONAL FACTORS: Time since onset of injury/illness/exacerbation are also affecting patient's functional outcome.   REHAB POTENTIAL: Good  CLINICAL DECISION MAKING: Stable/uncomplicated  EVALUATION COMPLEXITY: Low   GOALS: Goals reviewed with patient? Yes  SHORT TERM GOALS: Target date: 02/28/2024  Pt to be independent with initial HEP  Goal status: INITIAL  2.  Pt to reports optimal work station set up for work duties.   Goal status: INITIAL    LONG TERM GOALS: Target date: 04/10/2024  Pt to be independent with final HEP  Goal status: INITIAL  2.  Pt to report improved symptoms  by at least 75 %  Goal status: INITIAL  3.  Pt to report decreased pain in back to 0-4/10 with work duties and standing/IADLS.   Goal status: INITIAL  4.  Pt to demo optimal mechanics with bend, lift, squat for improved pain with IADLs.   Goal status: INITIAL    PLAN:  PT FREQUENCY: 1-2x/week  PT DURATION: 8 weeks  PLANNED INTERVENTIONS: Therapeutic exercises, Therapeutic activity, Neuromuscular re-education, Patient/Family education, Self Care, Joint mobilization, Joint manipulation, Stair training, Orthotic/Fit training, DME instructions, Aquatic Therapy, Dry Needling, Electrical stimulation, Cryotherapy, Moist heat, Taping, Ultrasound, Ionotophoresis 4mg /ml Dexamethasone, Manual therapy,  Vasopneumatic device, Traction, Spinal manipulation, Spinal mobilization,Balance  training, Gait training,   PLAN FOR NEXT SESSION: core and hip stabilization,   Sedalia Muta, PT, DPT 8:15 AM  03/01/24

## 2024-03-08 ENCOUNTER — Ambulatory Visit (INDEPENDENT_AMBULATORY_CARE_PROVIDER_SITE_OTHER): Payer: BC Managed Care – PPO | Admitting: Physical Therapy

## 2024-03-08 ENCOUNTER — Encounter: Payer: Self-pay | Admitting: Physical Therapy

## 2024-03-08 DIAGNOSIS — M5459 Other low back pain: Secondary | ICD-10-CM | POA: Diagnosis not present

## 2024-03-08 NOTE — Therapy (Addendum)
 OUTPATIENT PHYSICAL THERAPY LOWER EXTREMITY TREATMENT    Patient Name: Breanna Byrd MRN: [id number] DOB:Oct 12, 1981, 43 y.o., female Today's Date: 03/08/2024  END OF SESSION:  PT End of Session - 03/08/24 0808     Visit Number 4    Number of Visits 16    Date for PT Re-Evaluation 04/10/24    Authorization Type BC[employer]    PT Start Time 0809    PT Stop Time 0847    PT Time Calculation (min) 38 min    Activity Tolerance Patient tolerated treatment well    Behavior During Therapy [facility] for tasks assessed/performed              Past Medical History:  Diagnosis Date   B12 deficiency    Cyst of ovary 05/2023   large   Cyst of right ovary 06/07/2023   Dyspnea    Fatty liver    History of syncope 03/14/2023   ED visit in epic;  neurology evalution by dr dohmeier in epic 03-22-2023 w/ normal EEG;   cardiology evaluation by dr ladona 04-20-2023  w/ normal ETT, echo, and event monitor   Hyperlipidemia, mixed    IDA (iron deficiency anemia)    Nephrolithiasis 2022   per imaging in epic bilateral nonobstructive  (06-01-2023  that she know's of they have not passed)   Pelvic pain in female 05/2023   from large ovarian cyst   Pre-diabetes    Vitamin D  deficiency    Wears contact lenses    Past Surgical History:  Procedure Laterality Date   COLONOSCOPY W/ POLYPECTOMY  08/26/2022   dr san   LAPAROSCOPIC APPENDECTOMY N/A 10/08/2022   Procedure: APPENDECTOMY LAPAROSCOPIC;  Surgeon: Aron Shoulders, MD;  Location: [facility] OR;  Service: General;  Laterality: N/A;   LAPAROSCOPIC BILATERAL SALPINGO OOPHERECTOMY Bilateral 06/07/2023   Procedure: LAPAROSCOPIC BILATERAL SALPINGECTOMY, Right OOPHORECTOMY;  Surgeon: Barbette Knock, MD;  Location: [facility];  Service: Gynecology;  Laterality: Bilateral;   LAPAROSCOPIC OVARIAN CYSTECTOMY Right 06/07/2023   Procedure: LAPAROSCOPIC Right OVARIAN CYSTECTOMY/Pelvic Washings;  Surgeon: Barbette Knock, MD;  Location: [facility];  Service: Gynecology;  Laterality: Right;  Requests 2hrs.   UMBILICAL HERNIA REPAIR  10/08/2022   Procedure: HERNIA REPAIR UMBILICAL ADULT;  Surgeon: Aron Shoulders, MD;  Location: [facility] OR;  Service: General;;   Patient Active Problem List   Diagnosis Date Noted   Cyst of right ovary 06/07/2023   Syncope 03/22/2023   Sensation of chest pressure 03/22/2023   SOB (shortness of breath) on exertion 03/22/2023   Iron deficiency anemia secondary to inadequate dietary iron intake 03/22/2023   Dehydration after exertion 03/22/2023   Prediabetes 09/01/2022   ASCUS of cervix with negative high risk HPV 11/29/2020   Hyperlipidemia, mixed 08/23/2020   B12 deficiency 08/23/2020   Vitamin D  deficiency 08/23/2020     PCP: Jenkins Earnie Carrel  REFERRING PROVIDER: Artist Lloyd  REFERRING DIAG: Low back pain  THERAPY DIAG:  Other low back pain  Rationale for Evaluation and Treatment: Rehabilitation  ONSET DATE: Sept/Oct 2024.    SUBJECTIVE:   SUBJECTIVE STATEMENT: Pt states pain still doing much better. Has felt some pain for just a few min here and there.   Eval: Pt states in Sept/Oct she had increased pain for about 15 days. No incident to report. States increased pain last night when she was trying to go to sleep. Pain very  intermittent , does have some pain free days.  Had appendecomy and umbilical hernia  repair last year, states good recovery from this. Has had kidney stones in past, this pain does not feel like that, but pt states pain feels more inside vs muscle soreness outside.  No into glute,or into leg. No radicular pain.  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 Does have office/swivel chair.  States Bil pl fascia pain, newer, pain in both arches.  Will bring other shoes to look at  next visit. Wearing very flexible sketchers today.    PERTINENT HISTORY: LAPAROSCOPIC APPENDECTOMY, UMBILICAL HERNIA  REPAIR   PAIN:  Are you having pain? Yes: NPRS scale: 0-2 /10 Pain location: L low back Pain description: intermittent, sore, deep Aggravating factors: pt unable to state Relieving factors: heat,   PRECAUTIONS: None  WEIGHT BEARING RESTRICTIONS: No  FALLS:  Has patient fallen in last 6 months? No   PLOF: Independent  PATIENT GOALS:  Decreased pain in back  NEXT MD VISIT:   OBJECTIVE:   DIAGNOSTIC FINDINGS:   PATIENT SURVEYS:  will do next visit   Patient-Specific Activity Scoring Scheme  0 represents "unable to perform." 10 represents "able to perform at prior level. 0 1 2 3 4 5 6 7 8 9  10 (Date and Score)   Activity Eval     1.       2.       3.     4.    5.     Total score = sum of the activity scores/number of activities Minimum detectable change (90%CI) for average score = 2 points Minimum detectable change (90%CI) for single activity score = 3 points  Score:   COGNITION: Overall cognitive status: Within functional limits for tasks assessed     SENSATION: WFL  EDEMA:    POSTURE:  mod/significant LE valgus, bil pronation in feet.    PALPATION:  mild tenderness in L QL, no pain to palpate in thoracic or lumbar spine.   Tenderness in bil pl fascia   LOWER EXTREMITY ROM: Lumbar: WFL no pain Hips: WFL  LOWER EXTREMITY MMT:  MMT Left eval Right  eval  Hip flexion 4- 4-  Hip extension    Hip abduction 4+ 4+  Hip adduction    Hip internal rotation    Hip external rotation    Knee flexion 5 5  Knee extension 5 5  Ankle dorsiflexion    Ankle plantarflexion    Ankle inversion    Ankle eversion     (Blank rows = not tested)  LOWER EXTREMITY SPECIAL TESTS:  Neg SLR   FUNCTIONAL TESTS:    GAIT:    TODAY'S TREATMENT:                                                                                                                              DATE:   03/08/2024 Therapeutic Exercise: Aerobic: Supine:  pelvic tilts x 10;  TA  with education and practice for achieving contraction   SLR with TA 2 x 10 bil;    bridging  2x 10;  supine march x 10 with TA  Seated: Standing:  hip abd 2 x 10 bil Rows  GTB x 20;   Stretches:   LTR x 15;   Neuromuscular Re-education: Manual Therapy: Therapeutic Activity: hip hinge-seated x 10;  Sit to stand x 5, squats at mat table x 10; education and practice for bend/squat/lift 15lb from 12 in height and from floor, x 5 ea; education on form for IADLs and back position  Self Care:     Therapeutic Exercise: Aerobic: Supine:  pelvic tilts x 10;   TA x 10 education for achieving contraction   SLR with TA 2 x 10 bil;    bridging 2 x 10;   Seated: Standing:  hip abd 2 x 10 bil Rows  GTB x 20;   Stretches:   LTR x 15;   Neuromuscular Re-education: Manual Therapy: Therapeutic Activity: Self Care:       Therapeutic Exercise: Aerobic: Supine:  pelvic tilts x 10;   TA x 10education for achieving contraction , with breathing x 10;  SLR with TA 2 x 10 bil;  Seated: Standing:  hip abd 2 x 10 bil- cueing for decreasing trunk compensation.  Stretches:   LTR x 15;   seated HSS 30 sec x 2 bil;  Neuromuscular Re-education: Manual Therapy: Therapeutic Activity: Self Care: education on foot position, neutral alignment and optimal footwear, sizing, to improve foot pain.    PATIENT EDUCATION:  Education details: PT POC, Exam findings, HEP Person educated: Patient Education method: Explanation, Demonstration, Tactile cues, Verbal cues, and Handouts Education comprehension: verbalized understanding, returned demonstration, verbal cues required, tactile cues required, and needs further education   HOME EXERCISE PROGRAM: Access Code: [email or web address]   ASSESSMENT:  CLINICAL IMPRESSION: Pt late to appt today. She is progressing well with less pain in back. Continued Focus for hip and core strength and stabilization , she is challenged with TA today and core stabilization due to weakness. She  will benefit from continued education and practice with core stability.   Eval: Patient presents with primary complaint of  pain in L side of low back. She has pain likely stemming from muscle tension, core weakness, and improper workstation set up. Pt with decreased strength of hips and core, and poor movement mechanics, as well as lack of effective HEP. She also has increased pain in bil feet consistent with pl fascia. Pt with decreased ability for full functional activities. Pt will  benefit from skilled PT to improve deficits and pain and to return to PLOF.   OBJECTIVE IMPAIRMENTS: decreased activity tolerance, decreased mobility, decreased strength, increased muscle spasms, improper body mechanics, and pain.   ACTIVITY LIMITATIONS: carrying, lifting, bending, standing, sleeping, stairs, and locomotion level  PARTICIPATION LIMITATIONS: meal prep, cleaning, laundry, driving, shopping, and community activity  PERSONAL FACTORS: Time since onset of injury/illness/exacerbation are also affecting patient's functional outcome.   REHAB POTENTIAL: Good  CLINICAL DECISION MAKING: Stable/uncomplicated  EVALUATION COMPLEXITY: Low   GOALS: Goals reviewed with patient? Yes  SHORT TERM GOALS: Target date: 02/28/2024  Pt to be independent with initial HEP  Goal status: INITIAL  2.  Pt to reports optimal work station set up for work duties.   Goal status: INITIAL    LONG TERM GOALS: Target date: 04/10/2024  Pt to be independent with final HEP  Goal status: INITIAL  2.  Pt to report improved symptoms  by at least 75 %  Goal status: INITIAL  3.  Pt to report decreased pain in back to 0-4/10 with work duties and standing/IADLS.   Goal status: INITIAL  4.  Pt to demo optimal mechanics with bend, lift, squat for improved pain with IADLs.   Goal status: INITIAL    PLAN:  PT FREQUENCY: 1-2x/week  PT DURATION: 8 weeks  PLANNED INTERVENTIONS: Therapeutic exercises, Therapeutic  activity, Neuromuscular re-education, Patient/Family education, Self Care, Joint mobilization, Joint manipulation, Stair training, Orthotic/Fit training, DME instructions, Aquatic Therapy, Dry Needling, Electrical stimulation, Cryotherapy, Moist heat, Taping, Ultrasound, Ionotophoresis 4mg /ml Dexamethasone , Manual therapy,  Vasopneumatic device, Traction, Spinal manipulation, Spinal mobilization,Balance training, Gait training,   PLAN FOR NEXT SESSION: core and hip stabilization,   Tinnie Don, PT, DPT 8:08 AM  03/08/24  PHYSICAL THERAPY DISCHARGE SUMMARY  Visits from Start of Care: 4   Plan: Patient agrees to discharge.  Patient goals were partially  met. Patient is being discharged due to not returning since last visit .     Tinnie Don, PT, DPT 1:36 PM  09/14/24

## 2024-03-15 ENCOUNTER — Encounter: Payer: BC Managed Care – PPO | Admitting: Physical Therapy

## 2024-03-22 ENCOUNTER — Encounter: Payer: BC Managed Care – PPO | Admitting: Physical Therapy

## 2024-03-26 ENCOUNTER — Other Ambulatory Visit: Payer: Self-pay | Admitting: Family Medicine

## 2024-03-26 NOTE — Telephone Encounter (Signed)
 Due appt

## 2024-03-29 ENCOUNTER — Ambulatory Visit: Payer: BC Managed Care – PPO | Admitting: Family Medicine

## 2024-04-18 ENCOUNTER — Ambulatory Visit: Admitting: Family Medicine

## 2024-04-22 ENCOUNTER — Other Ambulatory Visit: Payer: Self-pay | Admitting: Family Medicine

## 2024-04-25 ENCOUNTER — Ambulatory Visit (INDEPENDENT_AMBULATORY_CARE_PROVIDER_SITE_OTHER): Admitting: Family Medicine

## 2024-04-25 ENCOUNTER — Encounter: Payer: Self-pay | Admitting: Family Medicine

## 2024-04-25 VITALS — BP 107/72 | HR 70 | Temp 98.1°F | Resp 18 | Ht 64.0 in | Wt 225.4 lb

## 2024-04-25 DIAGNOSIS — E559 Vitamin D deficiency, unspecified: Secondary | ICD-10-CM

## 2024-04-25 DIAGNOSIS — D508 Other iron deficiency anemias: Secondary | ICD-10-CM

## 2024-04-25 DIAGNOSIS — R7303 Prediabetes: Secondary | ICD-10-CM | POA: Diagnosis not present

## 2024-04-25 DIAGNOSIS — E782 Mixed hyperlipidemia: Secondary | ICD-10-CM

## 2024-04-25 DIAGNOSIS — E538 Deficiency of other specified B group vitamins: Secondary | ICD-10-CM

## 2024-04-25 MED ORDER — ROSUVASTATIN CALCIUM 20 MG PO TABS: 20.0000 mg | ORAL_TABLET | Freq: Every day | ORAL | 1 refills | Status: DC

## 2024-04-25 MED ORDER — METFORMIN HCL 500 MG PO TABS: 500.0000 mg | ORAL_TABLET | Freq: Every day | ORAL | 1 refills | Status: DC

## 2024-04-25 NOTE — Progress Notes (Signed)
 Subjective:     Patient ID: Breanna Byrd, female    DOB: 09-03-1981, 43 y.o.   MRN: [id number]  Chief Complaint  Patient presents with   Medical Management of Chronic Issues    3 month follow-up on cholesterol, pre dm     HPI-here w/husb B12, d, iron, predm, hld Discussed the use of AI scribe software for clinical note transcription with the patient, who gave verbal consent to proceed.  History of Present Illness Breanna Byrd is a 43 year old female who presents for follow-up on her chronic conditions. She is accompanied by her husband.  She has a history of B12 deficiency and was previously on B12 injections but has not taken them for a while. Her vegetarian diet may contribute to this deficiency.  She has prediabetes and is currently taking metformin  500mg  once daily in the morning. She is working on her diet by having two meals a day, with one meal being low in carbohydrates, and is incorporating more vegetables and proteins. She reports improved energy levels but no significant weight loss. She experiences hunger after her afternoon salad. Walks for exercise  She is on rosuvastatin  20 mg for hyperlipidemia and is consistent with her medication. She is also taking vitamin D  once a week but has stopped taking iron supplements. Her menstrual periods have become much lighter with the use of an IUD, and she is not experiencing heavy bleeding anymore.  She experiences shortness of breath when walking uphill, which she attributes to being overweight. She had a normal stress test over a year ago and is considering further evaluation.  She has reduced her alcohol consumption and is trying to maintain a regular exercise routine, including walking and other activities. Despite these efforts, she has not seen significant weight loss but feels more energetic.     There are no preventive care reminders to display for this patient.  Past Medical History:  Diagnosis Date   B12  deficiency    Cyst of ovary 05/2023   large   Cyst of right ovary 06/07/2023   Dyspnea    Fatty liver    History of syncope 03/14/2023   ED visit in epic;  neurology evalution by dr dohmeier in epic 03-22-2023 w/ normal EEG;   cardiology evaluation by dr Berry Bristol 04-20-2023  w/ normal ETT, echo, and event monitor   Hyperlipidemia, mixed    IDA (iron deficiency anemia)    Nephrolithiasis 2022   per imaging in epic bilateral nonobstructive  (06-01-2023  that she know's of they have not passed)   Pelvic pain in female 05/2023   from large ovarian cyst   Pre-diabetes    Vitamin D  deficiency    Wears contact lenses     Past Surgical History:  Procedure Laterality Date   COLONOSCOPY W/ POLYPECTOMY  08/26/2022   dr Karene Oto   LAPAROSCOPIC APPENDECTOMY N/A 10/08/2022   Procedure: APPENDECTOMY LAPAROSCOPIC;  Surgeon: Lockie Rima, MD;  Location: [facility] OR;  Service: General;  Laterality: N/A;   LAPAROSCOPIC BILATERAL SALPINGO OOPHERECTOMY Bilateral 06/07/2023   Procedure: LAPAROSCOPIC BILATERAL SALPINGECTOMY, Right OOPHORECTOMY;  Surgeon: Terri Fester, MD;  Location[facility];  Service: Gynecology;  Laterality: Bilateral;   LAPAROSCOPIC OVARIAN CYSTECTOMY Right 06/07/2023   Procedure: LAPAROSCOPIC Right OVARIAN CYSTECTOMY/Pelvic Washings;  Surgeon: Terri Fester, MD;  Location: [facility];  Service: Gynecology;  Laterality: Right;  Requests 2hrs.   UMBILICAL HERNIA REPAIR  10/08/2022   Procedure: HERNIA REPAIR UMBILICAL ADULT;  Surgeon: Cherlynn Cornfield,  Bretta Camp, MD;  Location: [facility] OR;  Service: General;;     Current Outpatient Medications:    acetaminophen  (TYLENOL ) 500 MG tablet, Take 1 tablet (500 mg total) by mouth every 6 (six) hours as needed., Disp: 30 tablet, Rfl: 0   ferrous sulfate 325 (65 FE) MG EC tablet, Take 325 mg by mouth 2 (two) times daily., Disp: , Rfl:    levonorgestrel  (MIRENA , 52 MG,) 20 MCG/DAY IUD, Take 1 device by intrauterine route., Disp: ,  Rfl:    Vitamin D , Ergocalciferol , (DRISDOL ) 1.25 MG (50000 UNIT) CAPS capsule, Take 1 capsule (50,000 Units total) by mouth every 7 (seven) days. twice weekly for 4 wks, then weekly for 2 more months., Disp: 12 capsule, Rfl: 3   metFORMIN  (GLUCOPHAGE ) 500 MG tablet, Take 1 tablet (500 mg total) by mouth daily with breakfast., Disp: 90 tablet, Rfl: 1   rosuvastatin  (CRESTOR ) 20 MG tablet, Take 1 tablet (20 mg total) by mouth daily., Disp: 90 tablet, Rfl: 1  No Known Allergies ROS neg/noncontributory except as noted HPI/below      Objective:     BP 107/72   Pulse 70   Temp 98.1 F (36.7 C) (Temporal)   Resp 18   Ht 5\' 4"  (1.626 m)   Wt 225 lb 6 oz (102.2 kg)   SpO2 97%   BMI 38.69 kg/m  Wt Readings from Last 3 Encounters:  04/25/24 225 lb 6 oz (102.2 kg)  02/02/24 228 lb (103.4 kg)  12/17/23 229 lb 8 oz (104.1 kg)    Physical Exam   Gen: WDWN NAD HEENT: NCAT, conjunctiva not injected, sclera nonicteric NECK:  supple, no thyromegaly, no nodes, no carotid bruits CARDIAC: RRR, S1S2+, no murmur. DP 2+B LUNGS: CTAB. No wheezes ABDOMEN:  BS+, soft, NTND, No HSM, no masses EXT:  no edema MSK: no gross abnormalities.  NEURO: A&O x3.  CN II-XII intact.  PSYCH: normal mood. Good eye contact     Assessment & Plan:  Hyperlipidemia, mixed -     Comprehensive metabolic panel with GFR; Future -     Lipid panel; Future -     Rosuvastatin  Calcium ; Take 1 tablet (20 mg total) by mouth daily.  Dispense: 90 tablet; Refill: 1  Prediabetes -     Hemoglobin A1c; Future -     metFORMIN  HCl; Take 1 tablet (500 mg total) by mouth daily with breakfast.  Dispense: 90 tablet; Refill: 1  B12 deficiency -     Vitamin B12; Future  Vitamin D  deficiency -     VITAMIN D  25 Hydroxy (Vit-D Deficiency, Fractures); Future  Iron deficiency anemia secondary to inadequate dietary iron intake -     CBC with Differential/Platelet; Future -     IBC + Ferritin; Future -     Vitamin B12;  Future  Assessment and Plan Assessment & Plan Obesity- visit focusing on weight management, dietary habits, and exercise routines. She reports increased energy with dietary changes but struggles with weight loss. Discussed the potential use of Wegovy  or zepbound for weight management, highlighting risks such as nausea, diarrhea, constipation, and rare gallbladder and pancreas issues, alongside benefits like improved blood sugar and cholesterol levels. There is uncertainty about long-term outcomes and potential weight regain after discontinuation. Discuss Wegovy  with insurance for coverage. Encourage continued dietary modifications and exercise. She has been working on diet/exercise for many months.  Overweight   She is overweight and struggling with weight loss despite dietary changes and exercise. Discussed Wegovy  for weight management,  with concerns about long-term effects and potential weight regain. Consider protein supplementation to increase dietary protein intake.  Prediabetes   Currently on metformin  once daily, she reports dietary changes and increased energy levels. Discussed weight loss benefits on blood sugar and possible metformin  dosage adjustment based on lab results. Continue metformin  once daily. Order fasting blood work to assess glucose levels. Consider increasing metformin  dosage   Hyperlipidemia   She takes rosuvastatin  20 mg daily. Discussed weight loss impact on cholesterol levels. Plan to assess lipid levels with fasting blood work. Continue rosuvastatin  20 mg daily. Order fasting blood work to assess lipid levels.  Iron deficiency anemia   Previously had heavy menstrual cycles contributing to iron deficiency. Now has an IUD with much lighter periods and occasional skipping. Discussed dietary iron sources and potential supplementation. Order fasting blood work to assess iron levels. Consider multivitamin with iron if dietary intake is insufficient.  Vitamin B12 deficiency    She has stopped B12 injections. Discussed potential lifelong supplementation due to vegetarian dietary restrictions. Plan to assess B12 levels with fasting blood work before deciding on further supplementation. Order fasting blood work to assess B12 levels.  Vitamin D  deficiency   She takes vitamin D  once a week. Plan to assess vitamin D  levels with fasting blood work to determine if dosage adjustments are needed. Order fasting blood work to assess vitamin D  levels.  Follow-up   Discussed follow-up plans for lab work and potential cardiology evaluation. Schedule follow-up appointment for fasting blood work. Consider follow-up with a cardiologist for exertional dyspnea.    Return in about 6 months (around 10/26/2024) for chronic follow-up.    soon fasting labs.  Ellsworth Haas, MD

## 2024-04-25 NOTE — Patient Instructions (Addendum)
 Zepbound and wegovy .It was very nice to see you today!     PLEASE NOTE:  If you had any lab tests please let us  know if you have not heard back within a few days. You may see your results on MyChart before we have a chance to review them but we will give you a call once they are reviewed by us . If we ordered any referrals today, please let us  know if you have not heard from their office within the next week.   Please try these tips to maintain a healthy lifestyle:  Eat most of your calories during the day when you are active. Eliminate processed foods including packaged sweets (pies, cakes, cookies), reduce intake of potatoes, white bread, white pasta, and white rice. Look for whole grain options, oat flour or almond flour.  Each meal should contain half fruits/vegetables, one quarter protein, and one quarter carbs (no bigger than a computer mouse).  Cut down on sweet beverages. This includes juice, soda, and sweet tea. Also watch fruit intake, though this is a healthier sweet option, it still contains natural sugar! Limit to 3 servings daily.  Drink at least 1 glass of water with each meal and aim for at least 8 glasses per day  Exercise at least 150 minutes every week.

## 2024-04-27 ENCOUNTER — Other Ambulatory Visit

## 2024-05-04 ENCOUNTER — Other Ambulatory Visit (INDEPENDENT_AMBULATORY_CARE_PROVIDER_SITE_OTHER)

## 2024-05-04 DIAGNOSIS — R7303 Prediabetes: Secondary | ICD-10-CM | POA: Diagnosis not present

## 2024-05-04 DIAGNOSIS — E538 Deficiency of other specified B group vitamins: Secondary | ICD-10-CM

## 2024-05-04 DIAGNOSIS — D508 Other iron deficiency anemias: Secondary | ICD-10-CM | POA: Diagnosis not present

## 2024-05-04 DIAGNOSIS — E782 Mixed hyperlipidemia: Secondary | ICD-10-CM

## 2024-05-04 DIAGNOSIS — E559 Vitamin D deficiency, unspecified: Secondary | ICD-10-CM

## 2024-05-04 LAB — CBC WITH DIFFERENTIAL/PLATELET
Basophils Absolute: 0 10*3/uL (ref 0.0–0.1)
Basophils Relative: 0.5 % (ref 0.0–3.0)
Eosinophils Absolute: 0.1 10*3/uL (ref 0.0–0.7)
Eosinophils Relative: 1.5 % (ref 0.0–5.0)
HCT: 39.7 % (ref 36.0–46.0)
Hemoglobin: 13.1 g/dL (ref 12.0–15.0)
Lymphocytes Relative: 32 % (ref 12.0–46.0)
Lymphs Abs: 1.7 10*3/uL (ref 0.7–4.0)
MCHC: 32.9 g/dL (ref 30.0–36.0)
MCV: 84.1 fl (ref 78.0–100.0)
Monocytes Absolute: 0.4 10*3/uL (ref 0.1–1.0)
Monocytes Relative: 8.3 % (ref 3.0–12.0)
Neutro Abs: 3.1 10*3/uL (ref 1.4–7.7)
Neutrophils Relative %: 57.7 % (ref 43.0–77.0)
Platelets: 269 10*3/uL (ref 150.0–400.0)
RBC: 4.71 Mil/uL (ref 3.87–5.11)
RDW: 15.1 % (ref 11.5–15.5)
WBC: 5.4 10*3/uL (ref 4.0–10.5)

## 2024-05-04 LAB — LIPID PANEL
Cholesterol: 157 mg/dL (ref 0–200)
HDL: 54.7 mg/dL (ref >39.00)
LDL Cholesterol: 75 mg/dL (ref 0–99)
NonHDL: 102
Total CHOL/HDL Ratio: 3
Triglycerides: 136 mg/dL (ref 0.0–149.0)
VLDL: 27.2 mg/dL (ref 0.0–40.0)

## 2024-05-04 LAB — VITAMIN D 25 HYDROXY (VIT D DEFICIENCY, FRACTURES): VITD: 17.96 ng/mL — ABNORMAL LOW (ref 30.00–100.00)

## 2024-05-04 LAB — COMPREHENSIVE METABOLIC PANEL WITH GFR
ALT: 13 U/L (ref 0–35)
AST: 13 U/L (ref 0–37)
Albumin: 4.1 g/dL (ref 3.5–5.2)
Alkaline Phosphatase: 51 U/L (ref 39–117)
BUN: 6 mg/dL (ref 6–23)
CO2: 25 meq/L (ref 19–32)
Calcium: 8.7 mg/dL (ref 8.4–10.5)
Chloride: 105 meq/L (ref 96–112)
Creatinine, Ser: 0.56 mg/dL (ref 0.40–1.20)
GFR: 111.09 mL/min (ref >60.00)
Glucose, Bld: 92 mg/dL (ref 70–99)
Potassium: 4.3 meq/L (ref 3.5–5.1)
Sodium: 138 meq/L (ref 135–145)
Total Bilirubin: 0.5 mg/dL (ref 0.2–1.2)
Total Protein: 6.4 g/dL (ref 6.0–8.3)

## 2024-05-04 LAB — IBC + FERRITIN
Ferritin: 4.8 ng/mL — ABNORMAL LOW (ref 10.0–291.0)
Iron: 85 ug/dL (ref 42–145)
Saturation Ratios: 16.5 % — ABNORMAL LOW (ref 20.0–50.0)
TIBC: 515.2 ug/dL — ABNORMAL HIGH (ref 250.0–450.0)
Transferrin: 368 mg/dL — ABNORMAL HIGH (ref 212.0–360.0)

## 2024-05-04 LAB — HEMOGLOBIN A1C: Hgb A1c MFr Bld: 5.8 % (ref 4.6–6.5)

## 2024-05-04 LAB — VITAMIN B12: Vitamin B-12: 206 pg/mL — ABNORMAL LOW (ref 211–911)

## 2024-05-07 ENCOUNTER — Ambulatory Visit: Payer: Self-pay | Admitting: Family Medicine

## 2024-05-07 NOTE — Progress Notes (Signed)
 Vitamin D  low-does she prefer the prescription once weekly or taking otc daily 2000IU? 2.  B12 very low-either needs to take the B12 vitamin daily or get back on injections 3.  Iron stores are very low-needs to take the iron every other day 4.  A1C better-continue metformin , diet/exercise 5.  Cholesterol at [facility] on rosuvastatin 

## 2024-05-16 ENCOUNTER — Other Ambulatory Visit: Payer: Self-pay | Admitting: *Deleted

## 2024-05-16 MED ORDER — VITAMIN D (ERGOCALCIFEROL) 1.25 MG (50000 UNIT) PO CAPS
50000.0000 [IU] | ORAL_CAPSULE | ORAL | 1 refills | Status: DC
Start: 2024-05-16 — End: 2024-08-06

## 2024-05-23 DIAGNOSIS — Z01419 Encounter for gynecological examination (general) (routine) without abnormal findings: Secondary | ICD-10-CM | POA: Diagnosis not present

## 2024-05-23 DIAGNOSIS — Z1331 Encounter for screening for depression: Secondary | ICD-10-CM | POA: Diagnosis not present

## 2024-06-18 NOTE — Progress Notes (Unsigned)
 Cardiology Office Note:  .   Date:  06/19/2024  ID:  Breanna Byrd, Breanna Byrd, Breanna Byrd, MRN [id number] PCP: Wendolyn Jenkins Jansky, MD  [facility] Providers Cardiologist:  Gordy Bergamo, MD   History of Present Illness: .   Breanna Byrd is a 43 y.o. Asian [place] female patient referred to me for evaluation of syncope and had last seen her on 03/24/2023 and felt either syncope was most probably the etiology although less likely seizure disorder.  She underwent echocardiogram, routine treadmill stress test and extended EKG monitoring in May 2024 which was unyielding.  She now presents for annual follow-up.  She remains asymptomatic except for mild chronic dyspnea on extremes of exertion and is accompanied by her daughter.  Past medical history significant for hypercholesterolemia, prediabetes, moderate obesity.  Echocardiogram on 04/21/2023 revealing essentially normal LVEF with mild MR and Zio patch monitor for 2 weeks revealing no significant arrhythmias and occasional PACs and PVCs.  Discussed the use of AI scribe software for clinical note transcription with the patient, who gave verbal consent to proceed.  History of Present Illness Breanna Byrd is a 43 year old female who presents for follow-up regarding syncope and weight management. She is accompanied by her daughter, Verdie.  She experiences shortness of breath, particularly when climbing stairs, but denies chest pain or tightness. She had a sync[place] episode over [place] ago [place] with no recurrence. A stress test and two-week heart monitoring showed no heart blocks or abnormalities.  She is on Crestor  20 mg once daily for cholesterol and Metformin  for prediabetes. Her cholesterol levels are well controlled with an LDL of 75 and HDL of 54.6. Blood pressure is well controlled at 126/80.  She was unable to take previously prescribed weight loss medication due to insurance issues but is now ready to start as it is covered. She  maintains a diet of two meals per day and engages in physical activity.  Labs   Lab Results  Component Value Date   CHOL 157 05/04/2024   HDL 54.70 05/04/2024   LDLCALC 75 05/04/2024   TRIG 136.0 05/04/2024   CHOLHDL 3 05/04/2024   Lab Results  Component Value Date   NA 138 05/04/2024   K 4.3 05/04/2024   CO2 25 05/04/2024   GLUCOSE 92 05/04/2024   BUN 6 05/04/2024   CREATININE 0.56 05/04/2024   CALCIUM  8.7 05/04/2024   GFR 111.Byrd 05/04/2024   GFRNONAA >60 06/07/2023      Latest Ref Rng & Units 05/04/2024    8:49 AM 12/17/2023    8:35 AM 09/01/2023    8:56 AM  BMP  Glucose 70 - 99 mg/dL 92  92  82   BUN 6 - 23 mg/dL 6  8  7    Creatinine 0.40 - 1.20 mg/dL 9.43  9.43  9.38   Sodium 135 - 145 mEq/L 138  138  136   Potassium 3.5 - 5.1 mEq/L 4.3  3.9  4.1   Chloride 96 - 112 mEq/L 105  104  101   CO2 19 - 32 mEq/L 25  26  28    Calcium  8.4 - 10.5 mg/dL 8.7  8.5  9.3       Latest Ref Rng & Units 05/04/2024    8:49 AM 12/17/2023    8:35 AM 09/01/2023    8:56 AM  CBC  WBC 4.0 - 10.5 K/uL 5.4  6.3  5.6   Hemoglobin 12.0 - 15.0 g/dL 86.8  87.4  13.0  Hematocrit 36.0 - 46.0 % 39.7  38.5  40.0   Platelets 150.0 - 400.0 K/uL 269.0  308.0  342.0    Lab Results  Component Value Date   HGBA1C 5.8 05/04/2024    Lab Results  Component Value Date   TSH 2.31 03/12/2023    ROS  Review of Systems  Cardiovascular:  Positive for dyspnea on exertion (stable). Negative for chest pain and leg swelling.    Physical Exam:   VS:  BP 126/80 (BP Location: Left Arm, Patient Position: Sitting, Cuff Size: Large)   Pulse 65   Resp 16   Ht 5' 4 (1.626 m)   Wt 230 lb (104.3 kg)   SpO2 98%   BMI 39.48 kg/m    Wt Readings from Last 3 Encounters:  06/19/24 230 lb (104.3 kg)  04/25/24 225 lb 6 oz (102.2 kg)  02/02/24 228 lb (103.4 kg)    Physical Exam Constitutional:      Appearance: She is obese.  Neck:     Vascular: No carotid bruit or JVD.   Cardiovascular:     Rate and  Rhythm: Normal rate and regular rhythm.     Pulses: Intact distal pulses.     Heart sounds: Normal heart sounds. No murmur heard.    No gallop.  Pulmonary:     Effort: Pulmonary effort is normal.     Breath sounds: Normal breath sounds.  Abdominal:     General: Bowel sounds are normal.     Palpations: Abdomen is soft.   Musculoskeletal:     Right lower leg: No edema.     Left lower leg: No edema.    Studies Reviewed: .    Echocardiogram 04/21/2023: Normal LV systolic function with visual EF 60-65%. Left ventricle cavity is normal in size. Concentric hypertrophy of the left ventricle. Normal global wall motion. Normal diastolic filling pattern, normal LAP. Calculated EF 63%. Structurally normal mitral valve.  Mild (Grade I) mitral regurgitation. Structurally normal tricuspid valve with trace regurgitation. No evidence of pulmonary hypertension. No prior available for comparison.  Exercise treadmill stress test 04/26/2023: Exercise treadmill stress test performed using Bruce protocol.  Patient exercised for 7 minutes, and 8 seconds reached 8.8 METS, and 89% of age predicted maximum heart rate.  Exercise capacity was good.  No chest pain reported.  Normal heart rate and hemodynamic response. Stress EKG revealed no ischemic changes. Low risk study.   Zio Patch Extended out patient EKG monitoring 14 days starting 03/24/2023: Predominant Rhythm : Normal sinus rhythm.  Min HR: 52 bpm at 5:13 AM. Max HR 134 bpm at 7:41 PM Atrial arrhythmias: Rare PACs Atrial fibrillation: None Ventricular arrhythmias: Rare PVCs. PVC Burden <1% Heart Block: None Symptoms: No symptoms reported   Impression: Unremarkable transmission.  EKG:    EKG Interpretation Date/Time:  Monday June 19 2024 10:17:26 EDT Ventricular Rate:  66 PR Interval:  122 QRS Duration:  80 QT Interval:  424 QTC Calculation: 444 R Axis:   62  Text Interpretation: EKG 06/19/2024: Normal sinus rhythm at rate of 66 bpm, normal EKG.   No change from 03/14/2023. Confirmed by Brion Sossamon, Jagadeesh ([id number]) on 06/19/2024 10:25:47 AM    Medications ordered    No orders of the defined types were placed in this encounter.    ASSESSMENT AND PLAN: .      ICD-10-CM   1. Syncope and collapse  R55     2. Dyspnea on exertion  R06.Byrd EKG 12-Lead  Assessment & Plan Syncope-  resolved No recurrence of syncope since the last episode a year ago. All labs and stress tests are normal, with no signs of heart blocks. The syncope episode was likely an isolated event, and there is no current concern for heart disease.  Suspect it was a vasovagal episode  Dyspnea on exertion Do not suspect heart failure or anginal equivalent.  Although she has prediabetes, A1c is only minimally elevated and is presently on metformin .  Suspect obesity hypoventilation and deconditioning as the etiology especially noting that she had a normal exercise treadmill stress test on 04/26/2023 and she indeed does have good exercise tolerance.  Also do not suspect that she has significant CAD as the risk factors are well-controlled including lipids and obesity continues to be the only hindering factor.  Both from obesity, prediabetes, obesity persists despite efforts with diet and physical activity. Insurance now covers weight loss medication, and she is ready to start treatment with Wegovy  or Ozempic .  Discussed the challenges of weight management and the importance of therapy and support. Emphasized the potential side effects of GLP-1 agonists, such as nausea, and provided guidance on eating habits to mitigate these effects. Encouraged physical activity to enhance weight loss effects. - Send message to Dr. Jenkins Carrel to start the approval process for GLP-1 agonist (Wegovy  or Ozempic  or Mounjaro) - Advise on eating habits: eat slowly, skip meals if in a hurry, and pause during meals to assess hunger - Encourage physical activity to enhance weight loss effects - I will see her  on a PRN basis.    Signed,  Gordy Bergamo, MD, [facility] 06/19/2024, 7:32 PM [facility] [address] [place], [place] [address] Phone: [phone]. Fax:  [phone]

## 2024-06-19 ENCOUNTER — Encounter: Payer: Self-pay | Admitting: Cardiology

## 2024-06-19 ENCOUNTER — Ambulatory Visit: Attending: Cardiology | Admitting: Cardiology

## 2024-06-19 VITALS — BP 126/80 | HR 65 | Resp 16 | Ht 64.0 in | Wt 230.0 lb

## 2024-06-19 DIAGNOSIS — R55 Syncope and collapse: Secondary | ICD-10-CM

## 2024-06-19 DIAGNOSIS — R0609 Other forms of dyspnea: Secondary | ICD-10-CM

## 2024-06-19 NOTE — Patient Instructions (Signed)

## 2024-06-20 ENCOUNTER — Other Ambulatory Visit: Payer: Self-pay | Admitting: *Deleted

## 2024-06-20 ENCOUNTER — Encounter: Payer: Self-pay | Admitting: *Deleted

## 2024-06-20 ENCOUNTER — Telehealth: Payer: Self-pay | Admitting: *Deleted

## 2024-06-20 MED ORDER — WEGOVY 0.25 MG/0.5ML ~~LOC~~ SOAJ: 0.2500 mg | SUBCUTANEOUS | 0 refills | Status: DC

## 2024-06-20 NOTE — Progress Notes (Signed)
Sent patient mychart message informing of message below

## 2024-06-20 NOTE — Telephone Encounter (Signed)
 Copied from CRM [id number]. Topic: Clinical - Medication Question >> Jun 20, 2024  2:15 PM Chiquita SQUIBB wrote: Reason for CRM: Patient is calling in requesting to speak to Joen to go over the Wegovy  medication.  Patient and husband informed of the PA process and informed that pcp has to send rx to the pharmacy first and then we can get PA started. Advised that I would keep them informed during the process as the PA team update the office. Both verbalized understanding.

## 2024-06-21 ENCOUNTER — Other Ambulatory Visit (HOSPITAL_COMMUNITY): Payer: Self-pay

## 2024-06-21 ENCOUNTER — Telehealth: Payer: Self-pay

## 2024-06-21 NOTE — Telephone Encounter (Signed)
 Pharmacy Patient Advocate Encounter   Received notification from Onbase that prior authorization for Wegovy  0.25MG /0.5ML auto-injectors is required/requested.   Insurance verification completed.   The patient is insured through [employer] .   Per test claim: PA required; PA submitted to above mentioned insurance via CoverMyMeds Key/confirmation #/EOC [id number] Status is pending

## 2024-06-21 NOTE — Telephone Encounter (Signed)
 Patient notified of message below.

## 2024-06-21 NOTE — Telephone Encounter (Signed)
 Pharmacy Patient Advocate Encounter  Received notification from [employer] that Prior Authorization for Wegovy  has been APPROVED from 05/22/24 to 01/17/15. Ran test claim, Copay is $50.00. This test claim was processed through [facility]- copay amounts may vary at other pharmacies due to pharmacy/plan contracts, or as the patient moves through the different stages of their insurance plan.   PA #/Case ID/Reference #: [id number]

## 2024-07-24 ENCOUNTER — Encounter: Payer: Self-pay | Admitting: Family Medicine

## 2024-07-24 ENCOUNTER — Other Ambulatory Visit: Payer: Self-pay | Admitting: Family Medicine

## 2024-07-24 MED ORDER — WEGOVY 0.5 MG/0.5ML ~~LOC~~ SOAJ
0.5000 mg | SUBCUTANEOUS | 0 refills | Status: DC
Start: 2024-07-24 — End: 2024-08-25

## 2024-08-05 ENCOUNTER — Other Ambulatory Visit: Payer: Self-pay | Admitting: Family Medicine

## 2024-08-25 ENCOUNTER — Other Ambulatory Visit: Payer: Self-pay | Admitting: Family Medicine

## 2024-08-25 ENCOUNTER — Encounter: Payer: Self-pay | Admitting: Family Medicine

## 2024-08-25 MED ORDER — WEGOVY 1 MG/0.5ML ~~LOC~~ SOAJ: 1.0000 mg | SUBCUTANEOUS | 0 refills | Status: DC

## 2024-10-03 ENCOUNTER — Other Ambulatory Visit: Payer: Self-pay | Admitting: Family Medicine

## 2024-10-07 DIAGNOSIS — M545 Low back pain, unspecified: Secondary | ICD-10-CM | POA: Diagnosis not present

## 2024-10-09 ENCOUNTER — Ambulatory Visit

## 2024-10-09 ENCOUNTER — Encounter: Payer: Self-pay | Admitting: Family Medicine

## 2024-10-09 ENCOUNTER — Telehealth: Payer: Self-pay

## 2024-10-09 ENCOUNTER — Ambulatory Visit: Admitting: Family Medicine

## 2024-10-09 VITALS — BP 126/84 | HR 84 | Ht 64.0 in

## 2024-10-09 DIAGNOSIS — M51362 Other intervertebral disc degeneration, lumbar region with discogenic back pain and lower extremity pain: Secondary | ICD-10-CM | POA: Diagnosis not present

## 2024-10-09 DIAGNOSIS — G8929 Other chronic pain: Secondary | ICD-10-CM

## 2024-10-09 DIAGNOSIS — M545 Low back pain, unspecified: Secondary | ICD-10-CM

## 2024-10-09 MED ORDER — PREDNISONE 50 MG PO TABS: 50.0000 mg | ORAL_TABLET | Freq: Every day | ORAL | 0 refills | Status: DC

## 2024-10-09 MED ORDER — TRAMADOL HCL 50 MG PO TABS: 50.0000 mg | ORAL_TABLET | Freq: Three times a day (TID) | ORAL | 0 refills | Status: DC | PRN

## 2024-10-09 NOTE — Telephone Encounter (Signed)
 Agree with plan

## 2024-10-09 NOTE — Progress Notes (Signed)
   I, Breanna Byrd, CMA acting as a scribe for Artist Lloyd, MD.  Breanna Byrd is a 43 y.o. female who presents to [facility] at [facility] today for exacerbation of lower back pain, left-sided. Pt was last seen by Dr. Lloyd on 02/02/24 for LBP. XR of the lumbar spine was obtained and pt was referred to Physical Therapy, completing 4 visits.   Today, patient reports exacerbation of left-sided lower back pain. Husband notes that sx started this past Friday, when pt was trying to sit low on the ground and then tried to get up, strained the back. Notes going to Urgent Care in [facility] yesterday and was given 2 IM injections and prescribed muscle relaxer. He states that pt has had no improvement of lower back sx, maybe even slightly worse than before. She called him and he had to come home from work today d/t pain being so bad. Has not taken the muscle relaxer today. Denies radiating pain or weakness in the legs. Also denies bowel bladder dysfunction. Has tried Tylenol  with no relief.    Pertinent review of systems: no fever or chills  Relevant historical information: Vitamin D  deficiency iron deficiency.   Exam:  BP 126/84   Pulse 84   Ht 5' 4 (1.626 m)   SpO2 (!) 86%   BMI 39.48 kg/m  General: Well Developed, well nourished, and in no acute distress.   MSK: L-spine nontender to palpation midline.  Tender palpation left lumbar paraspinal musculature. Decreased lumbar motion.  Lower extremity strength is intact.    Lab and Radiology Results  X-ray images lumbar spine and sacrum obtained today personally and independently interpreted. No acute fractures. Mild degenerative changes. Await formal radiology review     Assessment and Plan: 43 y.o. female with exacerbation of chronic low back pain without injury.  Pain due to muscle spasm and dysfunction..  Importantly Breanna Byrd and her husband are traveling to [place] next Wednesday the 29th.  We do need to  try to get her pain under control in a hurry.  Plan for urgent physical therapy referral, course of prednisone.  Recommend Tylenol  and ibuprofen  as needed and the cyclobenzaprine already prescribed as needed.  Schedule with me next week on Monday for recheck and reevaluation.   PDMP not reviewed this encounter. Orders Placed This Encounter  Procedures   Ambulatory referral to Physical Therapy    Referral Priority:   Routine    Referral Type:   Physical Medicine    Referral Reason:   Specialty Services Required    Requested Specialty:   Physical Therapy    Number of Visits Requested:   1   No orders of the defined types were placed in this encounter.    Discussed warning signs or symptoms. Please see discharge instructions. Patient expresses understanding.   The above documentation has been reviewed and is accurate and complete Artist Lloyd, M.D.

## 2024-10-09 NOTE — Telephone Encounter (Signed)
 Spoke with husband Speagle. He notes that since Friday, when pt was trying to sit low on the ground and then tried to get up, strained the back. Notes going to Urgent Care in [facility] yesterday and was given 2 IM injections and prescribed muscle relaxer. He states that pt has had no improvement of lower back sx, maybe even slightly worse than before. She called him and he had to come home from work today d/t pain being so bad. Has not taken the muscle relaxer today. Denies radiating pain or weakness in the legs. Also denies bowel bladder dysfunction. Has tried Tylenol  with no relief.   Agreeable to trying Tramadol and oral Prednisone.   Per Dr. Joane OK to place order for XR L-Spine and sacrum.   Forwarding to Dr. Joane to prescribe.

## 2024-10-09 NOTE — Addendum Note (Signed)
 Addended by: JOANE ARTIST RAMAN on: 10/09/2024 10:47 AM   Modules accepted: Orders

## 2024-10-09 NOTE — Patient Instructions (Addendum)
 Thank you for coming in today.   Ok to take 10mg  of cyclobenzaprine (2 pills) at bedtime as needed.   OK to take with 1000mg  of tylenol  (2 extra strength)   OK to take with up to 800mg  of ibuprofen  (4 pills)   Ok to take with the 50mg  tramadol pill I prescribed this morning.   Take the prednisone now  Plan for physical therapy.   Ok to use a heating pad as well.   Return in 1 week.

## 2024-10-10 DIAGNOSIS — M5136 Other intervertebral disc degeneration, lumbar region with discogenic back pain only: Secondary | ICD-10-CM | POA: Diagnosis not present

## 2024-10-11 ENCOUNTER — Ambulatory Visit: Admitting: Family Medicine

## 2024-10-11 DIAGNOSIS — M5136 Other intervertebral disc degeneration, lumbar region with discogenic back pain only: Secondary | ICD-10-CM | POA: Diagnosis not present

## 2024-10-12 ENCOUNTER — Ambulatory Visit: Payer: Self-pay | Admitting: Family Medicine

## 2024-10-12 DIAGNOSIS — M5136 Other intervertebral disc degeneration, lumbar region with discogenic back pain only: Secondary | ICD-10-CM | POA: Diagnosis not present

## 2024-10-12 NOTE — Progress Notes (Signed)
 X-ray of the sacrum and coccyx shows no broken bones.

## 2024-10-12 NOTE — Progress Notes (Signed)
 Low back x-ray shows a little bit of scoliosis and a little bit of arthritis but nothing severe.

## 2024-10-13 DIAGNOSIS — M5136 Other intervertebral disc degeneration, lumbar region with discogenic back pain only: Secondary | ICD-10-CM | POA: Diagnosis not present

## 2024-10-16 ENCOUNTER — Ambulatory Visit: Admitting: Family Medicine

## 2024-10-16 VITALS — BP 104/60 | HR 81 | Ht 64.0 in | Wt 223.0 lb

## 2024-10-16 DIAGNOSIS — M545 Low back pain, unspecified: Secondary | ICD-10-CM

## 2024-10-16 DIAGNOSIS — G8929 Other chronic pain: Secondary | ICD-10-CM

## 2024-10-16 DIAGNOSIS — M5136 Other intervertebral disc degeneration, lumbar region with discogenic back pain only: Secondary | ICD-10-CM | POA: Diagnosis not present

## 2024-10-16 MED ORDER — TRAMADOL HCL 50 MG PO TABS: 50.0000 mg | ORAL_TABLET | Freq: Three times a day (TID) | ORAL | 0 refills | Status: AC | PRN

## 2024-10-16 MED ORDER — PREDNISONE 50 MG PO TABS: 50.0000 mg | ORAL_TABLET | Freq: Every day | ORAL | 0 refills | Status: DC

## 2024-10-16 NOTE — Patient Instructions (Signed)
 Thank you for coming in today.   Recheck as needed.   Take the tramadol and prednisone if needed.

## 2024-10-16 NOTE — Progress Notes (Unsigned)
   I, [name] am a scribe for Dr. Artist Lloyd, MD.  Breanna Byrd is a 43 y.o. female who presents to [facility] at [facility] today for f/u LBP. Pt was last seen by Dr. Lloyd on 10/09/24 and was advised to use tylenol , IBU, already prescribed cyclobenzaprine. Also prescribed prednisone and urgently referred to [facility].  Today, pt reports that she is feeling all better. Just a little pain still left but not much.   Pt is leaving for [name] on Wednesday.  Dx imaging: 10/09/24 L-spine & sacrum/coccyx XR  02/02/24 L-spine XR  Pertinent review of systems: No fevers or chills  Relevant historical information: Vitamin D  deficiency   Exam:  BP 104/60   Pulse 81   Ht 5' 4 (1.626 m)   Wt 223 lb (101.2 kg)   SpO2 99%   BMI 38.28 kg/m  General: Well Developed, well nourished, and in no acute distress.   MSK: L-spine nontender to palpation normal lumbar motion.    Lab and Radiology Results No results found for this or any previous visit (from the past 72 hours). No results found.     Assessment and Plan: 43 y.o. female with improved back pain after a back pain flare identified last week.  Patient had significant proved with the prednisone and some tramadol.  She milligrams taking these medications.  I have refilled both the tramadol and the prednisone that she can use while traveling if needed if over-the-counter medications are insufficient.   PDMP not reviewed this encounter. No orders of the defined types were placed in this encounter.  Meds ordered this encounter  Medications   traMADol (ULTRAM) 50 MG tablet    Sig: Take 1 tablet (50 mg total) by mouth every 8 (eight) hours as needed for severe pain (pain score 7-10).    Dispense:  15 tablet    Refill:  0   predniSONE (DELTASONE) 50 MG tablet    Sig: Take 1 tablet (50 mg total) by mouth daily.    Dispense:  5 tablet    Refill:  0     Discussed warning signs or symptoms. Please  see discharge instructions. Patient expresses understanding.   The above documentation has been reviewed and is accurate and complete Artist Lloyd, M.D.

## 2024-10-24 ENCOUNTER — Telehealth: Payer: Self-pay | Admitting: Cardiology

## 2024-10-24 NOTE — Telephone Encounter (Signed)
 Husband says last year patient received a bill for $395 from [employer] for 03/2023 event monitor. At that time, husband called and informed them that patient did not wear monitor for full cycle because it was removed for an MRI. Zell was waived last year, but now patient just received another bill for the same thing. He called [employer] again and this time they informed him that the ordering doctor needs to send a notes stating that monitoring cycle was interrupted. Husband requests a call back to discuss. Please advise.

## 2024-10-24 NOTE — Telephone Encounter (Signed)
 Spoke with pt's spouse per DPR regarding a bill they received from [employer] over a year after wearing a monitor. Spouse stated that the pt only wore the monitor for 1 day before having to remove it for an MRA. Spouse stated the [employer] stated that a note from the nurse stated the pt did not complete the monitoring cycle would eliminate the bill. Spouse was told that his concerns would be forwarded. Pt verbalized understanding. All questions if any were answered.

## 2024-10-25 NOTE — Telephone Encounter (Signed)
 Message forwarded per JAYSON Real (Practice Operations Mgr). Please advise if this has been managed historically. Asking for support to guide this.  Also routing back to provider.

## 2024-10-25 NOTE — Telephone Encounter (Signed)
 Closed in error. Routing to monitor team and provider.

## 2024-10-26 ENCOUNTER — Ambulatory Visit: Admitting: Family Medicine

## 2024-10-26 NOTE — Telephone Encounter (Signed)
 She wore it for 2 weeks and I saw her after the monitor and her results again documented in the office visit. She wore it for almost 11 plus days

## 2024-10-30 ENCOUNTER — Other Ambulatory Visit: Payer: Self-pay | Admitting: Family Medicine

## 2024-10-31 ENCOUNTER — Ambulatory Visit: Admitting: Family Medicine

## 2024-11-07 ENCOUNTER — Ambulatory Visit: Payer: Self-pay | Admitting: Family Medicine

## 2024-11-07 ENCOUNTER — Encounter: Payer: Self-pay | Admitting: Family Medicine

## 2024-11-07 ENCOUNTER — Ambulatory Visit: Admitting: Family Medicine

## 2024-11-07 VITALS — BP 126/84 | HR 78 | Temp 98.2°F | Ht 64.0 in | Wt 219.4 lb

## 2024-11-07 DIAGNOSIS — E538 Deficiency of other specified B group vitamins: Secondary | ICD-10-CM | POA: Diagnosis not present

## 2024-11-07 DIAGNOSIS — E782 Mixed hyperlipidemia: Secondary | ICD-10-CM | POA: Diagnosis not present

## 2024-11-07 DIAGNOSIS — E559 Vitamin D deficiency, unspecified: Secondary | ICD-10-CM | POA: Diagnosis not present

## 2024-11-07 DIAGNOSIS — R7303 Prediabetes: Secondary | ICD-10-CM | POA: Diagnosis not present

## 2024-11-07 DIAGNOSIS — D508 Other iron deficiency anemias: Secondary | ICD-10-CM

## 2024-11-07 DIAGNOSIS — Z23 Encounter for immunization: Secondary | ICD-10-CM

## 2024-11-07 LAB — CBC WITH DIFFERENTIAL/PLATELET
Basophils Absolute: 0 K/uL (ref 0.0–0.1)
Basophils Relative: 0.2 % (ref 0.0–3.0)
Eosinophils Absolute: 0.1 K/uL (ref 0.0–0.7)
Eosinophils Relative: 1.3 % (ref 0.0–5.0)
HCT: 41.2 % (ref 36.0–46.0)
Hemoglobin: 13.6 g/dL (ref 12.0–15.0)
Lymphocytes Relative: 27.5 % (ref 12.0–46.0)
Lymphs Abs: 1.7 K/uL (ref 0.7–4.0)
MCHC: 33 g/dL (ref 30.0–36.0)
MCV: 87.7 fl (ref 78.0–100.0)
Monocytes Absolute: 0.5 K/uL (ref 0.1–1.0)
Monocytes Relative: 7.3 % (ref 3.0–12.0)
Neutro Abs: 4 K/uL (ref 1.4–7.7)
Neutrophils Relative %: 63.7 % (ref 43.0–77.0)
Platelets: 298 K/uL (ref 150.0–400.0)
RBC: 4.7 Mil/uL (ref 3.87–5.11)
RDW: 14.8 % (ref 11.5–15.5)
WBC: 6.3 K/uL (ref 4.0–10.5)

## 2024-11-07 LAB — COMPREHENSIVE METABOLIC PANEL WITH GFR
ALT: 20 U/L (ref 0–35)
AST: 16 U/L (ref 0–37)
Albumin: 4.2 g/dL (ref 3.5–5.2)
Alkaline Phosphatase: 51 U/L (ref 39–117)
BUN: 6 mg/dL (ref 6–23)
CO2: 27 meq/L (ref 19–32)
Calcium: 9.1 mg/dL (ref 8.4–10.5)
Chloride: 104 meq/L (ref 96–112)
Creatinine, Ser: 0.58 mg/dL (ref 0.40–1.20)
GFR: 109.76 mL/min (ref >60.00)
Glucose, Bld: 86 mg/dL (ref 70–99)
Potassium: 4.2 meq/L (ref 3.5–5.1)
Sodium: 139 meq/L (ref 135–145)
Total Bilirubin: 0.6 mg/dL (ref 0.2–1.2)
Total Protein: 6.5 g/dL (ref 6.0–8.3)

## 2024-11-07 LAB — MAGNESIUM: Magnesium: 1.9 mg/dL (ref 1.5–2.5)

## 2024-11-07 LAB — LIPID PANEL
Cholesterol: 166 mg/dL (ref 0–200)
HDL: 56.8 mg/dL (ref >39.00)
LDL Cholesterol: 84 mg/dL (ref 0–99)
NonHDL: 109.07
Total CHOL/HDL Ratio: 3
Triglycerides: 127 mg/dL (ref 0.0–149.0)
VLDL: 25.4 mg/dL (ref 0.0–40.0)

## 2024-11-07 LAB — IBC + FERRITIN
Ferritin: 11.2 ng/mL (ref 10.0–291.0)
Iron: 87 ug/dL (ref 42–145)
Saturation Ratios: 18.6 % — ABNORMAL LOW (ref 20.0–50.0)
TIBC: 467.6 ug/dL — ABNORMAL HIGH (ref 250.0–450.0)
Transferrin: 334 mg/dL (ref 212.0–360.0)

## 2024-11-07 LAB — VITAMIN B12: Vitamin B-12: 161 pg/mL — ABNORMAL LOW (ref 211–911)

## 2024-11-07 LAB — VITAMIN D 25 HYDROXY (VIT D DEFICIENCY, FRACTURES): VITD: 18.85 ng/mL — ABNORMAL LOW (ref 30.00–100.00)

## 2024-11-07 LAB — TSH: TSH: 1.51 u[IU]/mL (ref 0.35–5.50)

## 2024-11-07 LAB — HEMOGLOBIN A1C: Hgb A1c MFr Bld: 5.7 % (ref 4.6–6.5)

## 2024-11-07 MED ORDER — ROSUVASTATIN CALCIUM 20 MG PO TABS: 20.0000 mg | ORAL_TABLET | Freq: Every day | ORAL | 1 refills | Status: AC

## 2024-11-07 MED ORDER — METFORMIN HCL 500 MG PO TABS: 500.0000 mg | ORAL_TABLET | Freq: Every day | ORAL | 1 refills | Status: AC

## 2024-11-07 MED ORDER — WEGOVY 1.7 MG/0.75ML ~~LOC~~ SOAJ: 1.7000 mg | SUBCUTANEOUS | 0 refills | Status: DC

## 2024-11-07 NOTE — Progress Notes (Signed)
 Labs great except D,B12 and iron stores low-is she taking supplements and how much?

## 2024-11-07 NOTE — Progress Notes (Signed)
 Subjective:     Patient ID: Breanna Byrd, female    DOB: Apr 30, 1981, 43 y.o.   MRN: [id number]  Chief Complaint  Patient presents with   Hyperlipidemia    Pt is here to go over chronic issues    Discussed the use of AI scribe software for clinical note transcription with the patient, who gave verbal consent to proceed.  History of Present Illness Breanna Byrd is a 43 year old female who presents for follow-up.  She is actively managing her weight with Wegovy , currently at a 1 mg dose. She paused the medication for three weeks due to travel. Side effects include dry mouth and difficulty sleeping for the first two days post-injection, which she manages by staying hydrated. She has lost seven pounds, and others have noticed the weight loss. Despite side effects, she remains active with Taekwondo practice.  She engages in physical activity by participating in Taekwondo twice a week and walking or hiking on weekends, although she finds walking boring. During a recent hike to [facility], she experienced dry mouth and shortness of breath, attributed to altitude and exertion, managed by drinking water.  She is taking metformin  once daily at breakfast for prediabetes, rosuvastatin  20 mg for high cholesterol, and vitamin D  supplements. She has not had any new surgeries since her last visit and has seen a cardiologist who found no significant issues but recommended focusing on diet and exercise.  She experiences muscle spasms in her back, which shift locations, and has been seeing a physical therapist for this issue. No new chest pain, shortness of breath, or dizziness, except for shortness of breath during hiking.    There are no preventive care reminders to display for this patient.   Past Medical History:  Diagnosis Date   B12 deficiency    Cyst of ovary 05/2023   large   Cyst of right ovary 06/07/2023   Dyspnea    Fatty liver    History of syncope 03/14/2023   ED visit in  epic;  neurology evalution by dr dohmeier in epic 03-22-2023 w/ normal EEG;   cardiology evaluation by dr ladona 04-20-2023  w/ normal ETT, echo, and event monitor   Hyperlipidemia, mixed    IDA (iron deficiency anemia)    Nephrolithiasis 2022   per imaging in epic bilateral nonobstructive  (06-01-2023  that she know's of they have not passed)   Pelvic pain in female 05/2023   from large ovarian cyst   Pre-diabetes    Vitamin D  deficiency    Wears contact lenses     Past Surgical History:  Procedure Laterality Date   COLONOSCOPY W/ POLYPECTOMY  08/26/2022   dr san   LAPAROSCOPIC APPENDECTOMY N/A 10/08/2022   Procedure: APPENDECTOMY LAPAROSCOPIC;  Surgeon: Aron Shoulders, MD;  Location: [facility] OR;  Service: General;  Laterality: N/A;   LAPAROSCOPIC BILATERAL SALPINGO OOPHERECTOMY Bilateral 06/07/2023   Procedure: LAPAROSCOPIC BILATERAL SALPINGECTOMY, Right OOPHORECTOMY;  Surgeon: Barbette Knock, MD;  Location: [facility];  Service: Gynecology;  Laterality: Bilateral;   LAPAROSCOPIC OVARIAN CYSTECTOMY Right 06/07/2023   Procedure: LAPAROSCOPIC Right OVARIAN CYSTECTOMY/Pelvic Washings;  Surgeon: Barbette Knock, MD;  Location: [facility];  Service: Gynecology;  Laterality: Right;  Requests 2hrs.   UMBILICAL HERNIA REPAIR  10/08/2022   Procedure: HERNIA REPAIR UMBILICAL ADULT;  Surgeon: Aron Shoulders, MD;  Location: [facility] OR;  Service: General;;     Current Outpatient Medications:    acetaminophen  (TYLENOL ) 500 MG tablet, Take 1 tablet (500  mg total) by mouth every 6 (six) hours as needed., Disp: 30 tablet, Rfl: 0   ferrous sulfate 325 (65 FE) MG EC tablet, Take 325 mg by mouth 2 (two) times daily., Disp: , Rfl:    levonorgestrel  (MIRENA , 52 MG,) 20 MCG/DAY IUD, Take 1 device by intrauterine route., Disp: , Rfl:    traMADol (ULTRAM) 50 MG tablet, Take 1 tablet (50 mg total) by mouth every 8 (eight) hours as needed for severe pain (pain score 7-10)., Disp: 15  tablet, Rfl: 0   Vitamin D , Ergocalciferol , (DRISDOL ) 1.25 MG (50000 UNIT) CAPS capsule, Take 1 capsule (50,000 Units total) by mouth every 7 (seven) days., Disp: 13 capsule, Rfl: 0   metFORMIN  (GLUCOPHAGE ) 500 MG tablet, Take 1 tablet (500 mg total) by mouth daily with breakfast., Disp: 90 tablet, Rfl: 1   rosuvastatin  (CRESTOR ) 20 MG tablet, Take 1 tablet (20 mg total) by mouth daily., Disp: 90 tablet, Rfl: 1   semaglutide -weight management (WEGOVY ) 1.7 MG/0.75ML SOAJ SQ injection, Inject 1.7 mg into the skin once a week., Disp: 3 mL, Rfl: 0  No Known Allergies ROS neg/noncontributory except as noted HPI/below      Objective:     BP 126/84 (BP Location: Left Arm, Patient Position: Sitting)   Pulse 78   Temp 98.2 F (36.8 C) (Temporal)   Ht 5' 4 (1.626 m)   Wt 219 lb 6 oz (99.5 kg)   LMP 10/20/2024   SpO2 98%   BMI 37.66 kg/m  Wt Readings from Last 3 Encounters:  11/07/24 219 lb 6 oz (99.5 kg)  10/16/24 223 lb (101.2 kg)  06/19/24 230 lb (104.3 kg)    Physical Exam GENERAL: Well developed, well nourished, no acute distress. HEAD EYES EARS NOSE THROAT: Normocephalic, atraumatic, conjunctiva not injected, sclera nonicteric. CARDIAC: Regular rate and rhythm, S1 S2 present, no murmur, dorsalis pedis 2 plus bilaterally. NECK: Supple, no thyromegaly, no nodes, no carotid bruits. LUNGS: Clear to auscultation bilaterally, no wheezes. ABDOMEN: Bowel sounds present, soft, non-tender, non-distended, no hepatosplenomegaly, no masses. EXTREMITIES: No edema. MUSCULOSKELETAL: No gross abnormalities. NEUROLOGICAL: Alert and oriented x3, cranial nerves II through XII intact. PSYCHIATRIC: Normal mood, good eye contact.       Assessment & Plan:  Prediabetes -     Magnesium -     Comprehensive metabolic panel with GFR -     Hemoglobin A1c -     TSH -     metFORMIN  HCl; Take 1 tablet (500 mg total) by mouth daily with breakfast.  Dispense: 90 tablet; Refill: 1  Vitamin D   deficiency -     VITAMIN D  25 Hydroxy (Vit-D Deficiency, Fractures)  Mixed hypercholesterolemia and hypertriglyceridemia -     Comprehensive metabolic panel with GFR -     Lipid panel  B12 deficiency -     Vitamin B12  Iron deficiency anemia secondary to inadequate dietary iron intake -     CBC with Differential/Platelet -     IBC + Ferritin  Encounter for immunization -     Flu vaccine trivalent PF, 6mos and older(Flulaval,Afluria,Fluarix,Fluzone)  Hyperlipidemia, mixed -     Rosuvastatin  Calcium ; Take 1 tablet (20 mg total) by mouth daily.  Dispense: 90 tablet; Refill: 1  Other orders -     Wegovy ; Inject 1.7 mg into the skin once a week.  Dispense: 3 mL; Refill: 0    Assessment and Plan Assessment & Plan Prediabetes   Managed with metformin  once daily at breakfast. Continue metformin .  Mixed hyperlipidemia   Managed with rosuvastatin  20 mg. Continue rosuvastatin .  Obesity   Currently on Wegovy  1 mg for three weeks. Reports dry mouth and insomnia for the first two days post-injection, improving after three to four days. Lost seven pounds and remains active with taekwondo twice a week and walking or hiking on other days. Increase Wegovy  to 1.7 mg. Instruct to message in one month to increase Wegovy  to 2.4 mg. Encourage continued physical activity. Discuss potential use of biotin for dry mouth. Advise checking for copay card at [email or web address] [email or web address] to reduce cost.  Vitamin D  deficiency   Continue vitamin D  supplementation.  Iron deficiency anemia   Monitored with blood work. Order blood work to assess iron levels.  Deficiency of B group vitamins-check labs  Muscle spasm of back   Currently attending physical therapy. Continue physical therapy. Consider adding electrolytes to water and magnesium glycinate 500 mg at night for cramps and sleep.  General Health Maintenance   Received flu shot. Continue routine health maintenance.     Return in about 3 months (around 02/07/2025)  for wt.  Jenkins CHRISTELLA Carrel, MD

## 2024-11-07 NOTE — Patient Instructions (Addendum)
 It was very nice to see you today!  [employer] at night  [email or web address] for co-pay card  Add electrolytes to water once/day.   Add magnesium glycinate 500mg  at night   PLEASE NOTE:  If you had any lab tests please let us  know if you have not heard back within a few days. You may see your results on MyChart before we have a chance to review them but we will give you a call once they are reviewed by us . If we ordered any referrals today, please let us  know if you have not heard from their office within the next week.   Please try these tips to maintain a healthy lifestyle:  Eat most of your calories during the day when you are active. Eliminate processed foods including packaged sweets (pies, cakes, cookies), reduce intake of potatoes, white bread, white pasta, and white rice. Look for whole grain options, oat flour or almond flour.  Each meal should contain half fruits/vegetables, one quarter protein, and one quarter carbs (no bigger than a computer mouse).  Cut down on sweet beverages. This includes juice, soda, and sweet tea. Also watch fruit intake, though this is a healthier sweet option, it still contains natural sugar! Limit to 3 servings daily.  Drink at least 1 glass of water with each meal and aim for at least 8 glasses per day  Exercise at least 150 minutes every week.

## 2024-11-08 NOTE — Progress Notes (Signed)
 Pt has read results smk

## 2024-12-20 ENCOUNTER — Other Ambulatory Visit (HOSPITAL_COMMUNITY): Payer: Self-pay

## 2024-12-26 ENCOUNTER — Telehealth: Payer: Self-pay

## 2024-12-26 ENCOUNTER — Other Ambulatory Visit: Payer: Self-pay

## 2024-12-26 MED ORDER — WEGOVY 1.7 MG/0.75ML ~~LOC~~ SOAJ: 1.7000 mg | SUBCUTANEOUS | 0 refills | Status: AC

## 2024-12-26 NOTE — Telephone Encounter (Signed)
 Pt called she is doing b12 injections monthly, and Vitamin D  rx she started this week, also doing iron daily but having trouble with those because they are so big. She has been off of Wegovy  at least 3 weeks what does she need to do about that.

## 2024-12-26 NOTE — Telephone Encounter (Signed)
 Working from other note   Copied from [facility] [phone] [id number]. Topic: Clinical - Lab/Test Results >> Dec 25, 2024 12:50 PM Delon T wrote: Reason for CRM: patient calling to discuss supplements and labs drawn back in November- [phone]

## 2024-12-26 NOTE — Progress Notes (Signed)
 Called pt she was traveling out of the country and that is why she was off, sent in 1.7 also notified of the iron

## 2024-12-27 ENCOUNTER — Other Ambulatory Visit: Payer: Self-pay | Admitting: Family Medicine

## 2025-01-08 ENCOUNTER — Other Ambulatory Visit: Payer: Self-pay | Admitting: Family Medicine

## 2025-01-08 ENCOUNTER — Encounter: Payer: Self-pay | Admitting: Family Medicine

## 2025-01-08 MED ORDER — WEGOVY 1 MG/0.5ML ~~LOC~~ SOAJ: 1.0000 mg | SUBCUTANEOUS | 0 refills | Status: DC

## 2025-01-24 MED ORDER — WEGOVY 1 MG/0.5ML ~~LOC~~ SOAJ: 1.0000 mg | SUBCUTANEOUS | 0 refills | Status: AC
